# Patient Record
Sex: Female | Born: 1975 | Race: Black or African American | Hispanic: No | Marital: Single | Smoking: Current every day smoker
Health system: Southern US, Community
[De-identification: ages and names within clinical notes are randomized; demographics above are authoritative.]

## PROBLEM LIST (undated history)

## (undated) DIAGNOSIS — I10 Essential (primary) hypertension: Secondary | ICD-10-CM

---

## 2022-04-08 ENCOUNTER — Ambulatory Visit
Admission: EM | Admit: 2022-04-08 | Discharge: 2022-04-08 | Disposition: A | Discharge status: Home / Self Care | Payer: Self-pay | Attending: Family Medicine | Admitting: Family Medicine

## 2022-04-08 DIAGNOSIS — R030 Elevated blood-pressure reading, without diagnosis of hypertension: Secondary | ICD-10-CM

## 2022-04-08 DIAGNOSIS — J3089 Other allergic rhinitis: Secondary | ICD-10-CM

## 2022-04-08 MED ORDER — AMLODIPINE BESYLATE 5 MG PO TABS: 5.0000 mg | ORAL_TABLET | Freq: Every day | ORAL | 0 refills | Status: DC

## 2022-04-08 NOTE — ED Triage Notes (Signed)
Pt presents with elevated blood pressure the last 2 times she tried to donate plasma.

## 2022-04-08 NOTE — ED Provider Notes (Signed)
[facility]    CSN: [id number] Arrival date & time: 04/08/22  1706      History   Chief Complaint Chief Complaint  Patient presents with   Hypertension    HPI Jeanette Petersen is a 46 y.o. female.   Presenting today with concerns of multiple elevated blood pressure readings while trying to donate plasma recently.  She states that she has a long history of elevated blood pressure readings but has never officially been diagnosed with hypertension.  Has only had to take medication for it while pregnant.  She denies any chest pain, headaches, blurry vision, shortness of breath, dizziness.  She has been trying to donate plasma and has gotten readings as high as 190s over 120s at the plasma center so as been told she needs to seek medical care prior to returning.  She is new to the area and does not yet have a primary care provider in the area.  She also complains of about 6 weeks of ongoing hacking cough, postnasal drainage.  She has a history of seasonal allergies but has never had to take anything for it as they were not that bad in [place] where she is from.  She has noticed the worsening symptoms since moving down here about 6 to 8 weeks ago.   History reviewed. No pertinent past medical history.  There are no problems to display for this patient.   History reviewed. No pertinent surgical history.  OB History   No obstetric history on file.      Home Medications    Prior to Admission medications   Medication Sig Start Date End Date Taking? Authorizing Provider  amLODipine (NORVASC) 5 MG tablet Take 1 tablet (5 mg total) by mouth daily. 04/08/22  Yes Particia Nearing, PA-C    Family History Family History  Family history unknown: Yes    Social History Social History   Tobacco Use   Smoking status: Unknown     Allergies   Patient has no known allergies.   Review of Systems Review of Systems Per HPI  Physical Exam Triage Vital Signs ED Triage  Vitals  Enc Vitals Group     BP 04/08/22 1735 (!) 174/121     Pulse Rate 04/08/22 1735 86     Resp 04/08/22 1735 18     Temp 04/08/22 1735 98.2 F (36.8 C)     Temp Source 04/08/22 1735 Oral     SpO2 04/08/22 1735 96 %     Weight --      Height --      Head Circumference --      Peak Flow --      Pain Score 04/08/22 1736 0     Pain Loc --      Pain Edu? --      Excl. in GC? --    No data found.  Updated Vital Signs BP (!) 174/121 (BP Location: Left Arm)   Pulse 86   Temp 98.2 F (36.8 C) (Oral)   Resp 18   LMP 03/28/2022   SpO2 96%   Visual Acuity Right Eye Distance:   Left Eye Distance:   Bilateral Distance:    Right Eye Near:   Left Eye Near:    Bilateral Near:     Physical Exam Vitals and nursing note reviewed.  Constitutional:      Appearance: Normal appearance. She is not ill-appearing.  HENT:     Head: Atraumatic.  Right Ear: Tympanic membrane normal.     Left Ear: Tympanic membrane normal.     Nose: Rhinorrhea present.     Mouth/Throat:     Mouth: Mucous membranes are moist.     Pharynx: Posterior oropharyngeal erythema present. No oropharyngeal exudate.  Eyes:     Extraocular Movements: Extraocular movements intact.     Conjunctiva/sclera: Conjunctivae normal.  Cardiovascular:     Rate and Rhythm: Normal rate and regular rhythm.     Heart sounds: Normal heart sounds.  Pulmonary:     Effort: Pulmonary effort is normal.     Breath sounds: Normal breath sounds. No wheezing or rales.  Musculoskeletal:        General: Normal range of motion.     Cervical back: Normal range of motion and neck supple.  Skin:    General: Skin is warm and dry.  Neurological:     Mental Status: She is alert and oriented to person, place, and time.  Psychiatric:        Mood and Affect: Mood normal.        Thought Content: Thought content normal.        Judgment: Judgment normal.     UC Treatments / Results  Labs (all labs ordered are listed, but only abnormal  results are displayed) Labs Reviewed - No data to display  EKG   Radiology No results found.  Procedures Procedures (including critical care time)  Medications Ordered in UC Medications - No data to display  Initial Impression / Assessment and Plan / UC Course  I have reviewed the triage vital signs and the nursing notes.  Pertinent labs & imaging results that were available during my care of the patient were reviewed by me and considered in my medical decision making (see chart for details).     Blood pressure significantly elevated today and per plasma center records has been elevated numerous times.  We will start low-dose amlodipine, DASH diet, increase exercise and work on establishing with primary care.  Discussed to follow-up here if not in with a primary care in the next 30 days to recheck blood pressure and adjust medication.  Try to keep home log of readings.  Return for any side effects or worsening symptoms in the meantime.  Suspect her ongoing cough is related to seasonal allergy exacerbation.  She states that she has Claritin and nasal spray at home and will start taking them consistently.  Final Clinical Impressions(s) / UC Diagnoses   Final diagnoses:  Elevated blood pressure reading  Seasonal allergic rhinitis due to other allergic trigger     Discharge Instructions      Go to the [facility] health website and click on the find a provider tab.  You can set up to establish care with a primary care straight from the website.  For your allergies, take an antihistamine at least once a day and a steroid nasal spray such as Flonase twice a day.  Follow-up either here or with primary care in the next 30 days to recheck your blood pressure and get a refill on your medication    ED Prescriptions     Medication Sig Dispense Auth. Provider   amLODipine (NORVASC) 5 MG tablet Take 1 tablet (5 mg total) by mouth daily. 30 tablet Particia Nearing, [place]      PDMP not  reviewed this encounter.   Particia Nearing, [place] 04/08/22 1818

## 2022-04-08 NOTE — Discharge Instructions (Signed)
Go to the [facility] health website and click on the find a provider tab.  You can set up to establish care with a primary care straight from the website.  For your allergies, take an antihistamine at least once a day and a steroid nasal spray such as Flonase twice a day.  Follow-up either here or with primary care in the next 30 days to recheck your blood pressure and get a refill on your medication

## 2022-04-14 ENCOUNTER — Observation Stay (HOSPITAL_COMMUNITY): Payer: Self-pay

## 2022-04-14 ENCOUNTER — Encounter (HOSPITAL_COMMUNITY): Payer: Self-pay | Admitting: Emergency Medicine

## 2022-04-14 ENCOUNTER — Ambulatory Visit: Admission: RE | Admit: 2022-04-14 | Discharge status: Absent without leave | Payer: Self-pay | Source: Ambulatory Visit

## 2022-04-14 ENCOUNTER — Other Ambulatory Visit: Payer: Self-pay

## 2022-04-14 ENCOUNTER — Emergency Department (HOSPITAL_COMMUNITY): Payer: Self-pay

## 2022-04-14 ENCOUNTER — Observation Stay (HOSPITAL_COMMUNITY)
Admission: EM | Admit: 2022-04-14 | Discharge: 2022-04-16 | Disposition: A | Discharge status: Home / Self Care | Payer: Self-pay | Attending: Internal Medicine | Admitting: Internal Medicine

## 2022-04-14 ENCOUNTER — Ambulatory Visit
Admission: RE | Admit: 2022-04-14 | Discharge: 2022-04-14 | Disposition: A | Discharge status: Home / Self Care | Payer: Self-pay | Source: Ambulatory Visit | Attending: Urgent Care | Admitting: Urgent Care

## 2022-04-14 VITALS — BP 170/121 | HR 90 | Temp 98.1°F | Resp 20

## 2022-04-14 DIAGNOSIS — I10 Essential (primary) hypertension: Secondary | ICD-10-CM

## 2022-04-14 DIAGNOSIS — I160 Hypertensive urgency: Secondary | ICD-10-CM

## 2022-04-14 DIAGNOSIS — R079 Chest pain, unspecified: Secondary | ICD-10-CM

## 2022-04-14 DIAGNOSIS — R519 Headache, unspecified: Secondary | ICD-10-CM

## 2022-04-14 DIAGNOSIS — G459 Transient cerebral ischemic attack, unspecified: Principal | ICD-10-CM

## 2022-04-14 DIAGNOSIS — R200 Anesthesia of skin: Secondary | ICD-10-CM

## 2022-04-14 DIAGNOSIS — D72829 Elevated white blood cell count, unspecified: Secondary | ICD-10-CM | POA: Insufficient documentation

## 2022-04-14 DIAGNOSIS — F17210 Nicotine dependence, cigarettes, uncomplicated: Secondary | ICD-10-CM | POA: Insufficient documentation

## 2022-04-14 DIAGNOSIS — I161 Hypertensive emergency: Secondary | ICD-10-CM | POA: Diagnosis present

## 2022-04-14 DIAGNOSIS — Z79899 Other long term (current) drug therapy: Secondary | ICD-10-CM | POA: Insufficient documentation

## 2022-04-14 HISTORY — DX: Essential (primary) hypertension: I10

## 2022-04-14 LAB — I-STAT BETA HCG BLOOD, ED (MC, WL, AP ONLY): I-stat hCG, quantitative: <5 m[IU]/mL (ref <5)

## 2022-04-14 LAB — HEPATIC FUNCTION PANEL
ALT: 18 U/L (ref 0–44)
AST: 16 U/L (ref 15–41)
Albumin: 3.5 g/dL (ref 3.5–5.0)
Alkaline Phosphatase: 62 U/L (ref 38–126)
Bilirubin, Direct: 0.1 mg/dL (ref 0.0–0.2)
Indirect Bilirubin: 0.4 mg/dL (ref 0.3–0.9)
Total Bilirubin: 0.5 mg/dL (ref 0.3–1.2)
Total Protein: 7.2 g/dL (ref 6.5–8.1)

## 2022-04-14 LAB — BASIC METABOLIC PANEL
Anion gap: 10 (ref 5–15)
BUN: 7 mg/dL (ref 6–20)
CO2: 25 mmol/L (ref 22–32)
Calcium: 9.8 mg/dL (ref 8.9–10.3)
Chloride: 101 mmol/L (ref 98–111)
Creatinine, Ser: 0.83 mg/dL (ref 0.44–1.00)
GFR, Estimated: >60 mL/min (ref >60)
Glucose, Bld: 102 mg/dL — ABNORMAL HIGH (ref 70–99)
Potassium: 3.6 mmol/L (ref 3.5–5.1)
Sodium: 136 mmol/L (ref 135–145)

## 2022-04-14 LAB — CBC
HCT: 50.4 % — ABNORMAL HIGH (ref 36.0–46.0)
Hemoglobin: 16.5 g/dL — ABNORMAL HIGH (ref 12.0–15.0)
MCH: 29.9 pg (ref 26.0–34.0)
MCHC: 32.7 g/dL (ref 30.0–36.0)
MCV: 91.5 fL (ref 80.0–100.0)
Platelets: 313 10*3/uL (ref 150–400)
RBC: 5.51 MIL/uL — ABNORMAL HIGH (ref 3.87–5.11)
RDW: 14 % (ref 11.5–15.5)
WBC: 12.3 10*3/uL — ABNORMAL HIGH (ref 4.0–10.5)
nRBC: 0 % (ref 0.0–0.2)

## 2022-04-14 LAB — LIPASE, BLOOD: Lipase: 29 U/L (ref 11–51)

## 2022-04-14 LAB — TROPONIN I (HIGH SENSITIVITY)
Troponin I (High Sensitivity): 9 ng/L (ref <18)
Troponin I (High Sensitivity): 10 ng/L (ref <18)

## 2022-04-14 IMAGING — CT CT HEAD W/O CM
3 of 4 series · 15 of 47 positions shown, 18 images · non-contrast
Comparison: None Available.

CLINICAL DATA: Headache, new or worsening, neuro deficit (Age
18-49y)



[Series 4: head 2.0 h70h · axial · 0.46mm/px · z∈[-67,+51]mm · 9 of 75 slices shown, 12 images]
[im 8/75  brain]
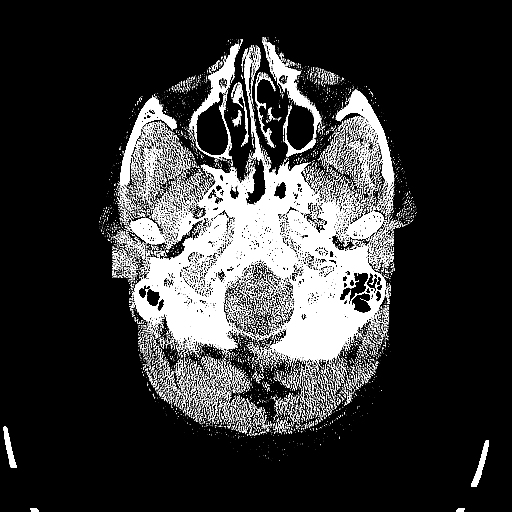
[im 8/75  bone]
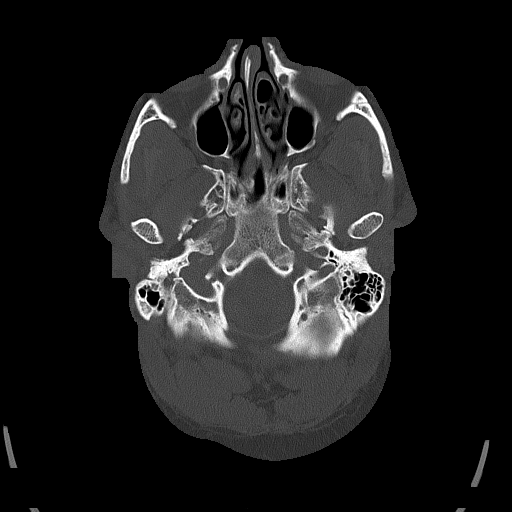
[im 15/75  brain]
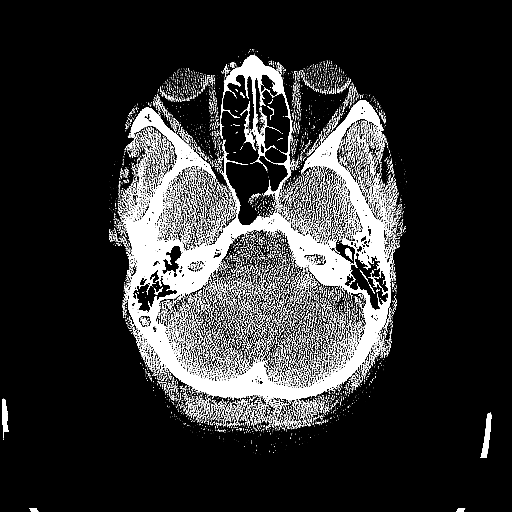
[im 23/75  brain]
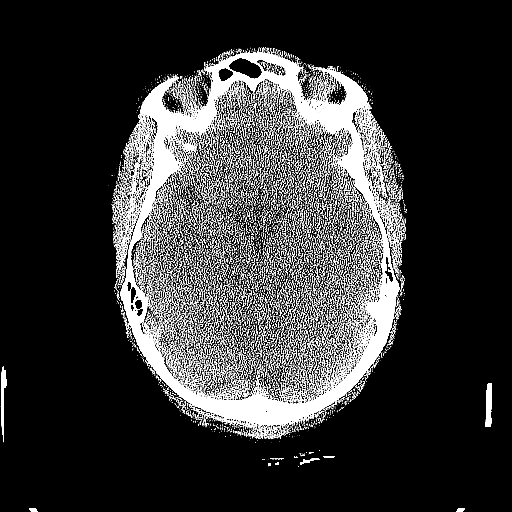
[im 30/75  brain]
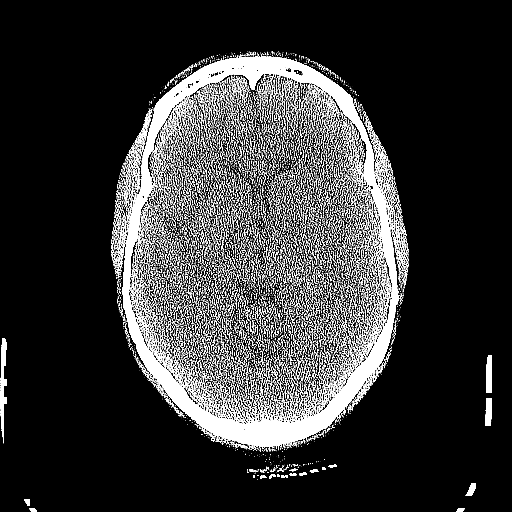
[im 38/75  brain]
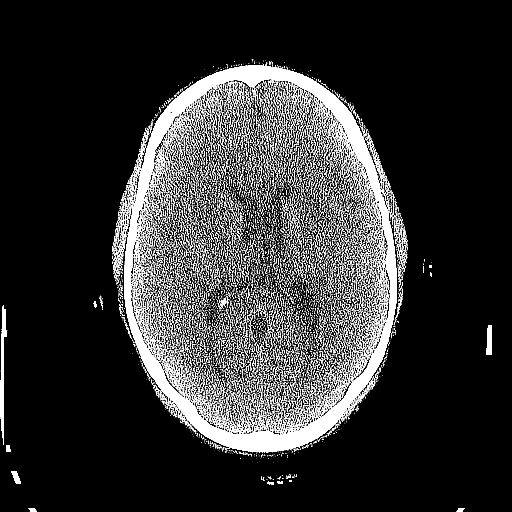
[im 38/75  bone]
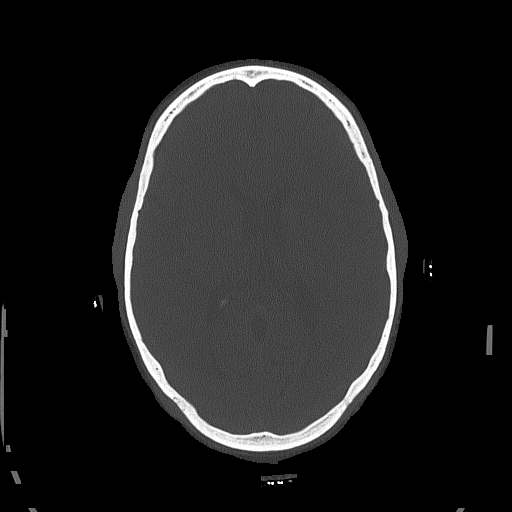
[im 45/75  brain]
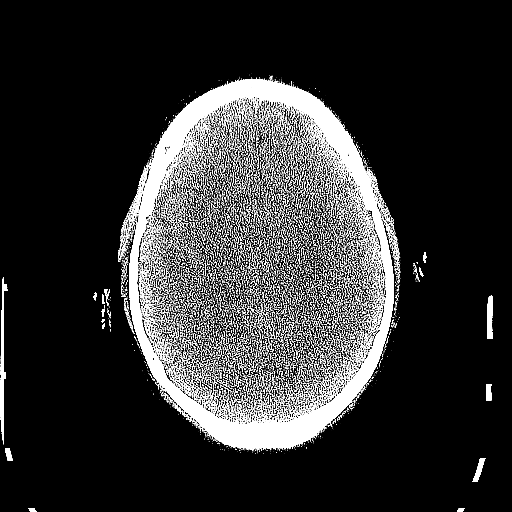
[im 52/75  brain]
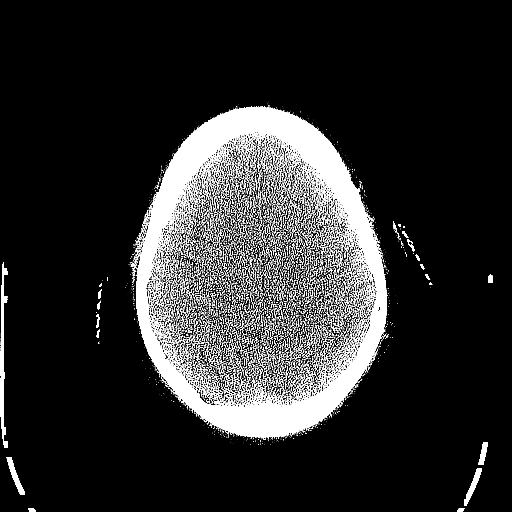
[im 60/75  brain]
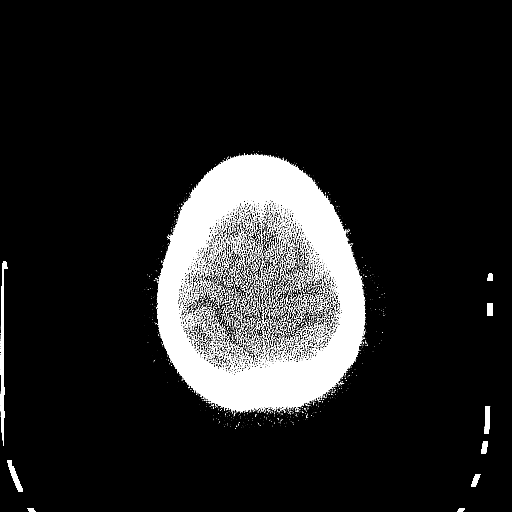
[im 67/75  brain]
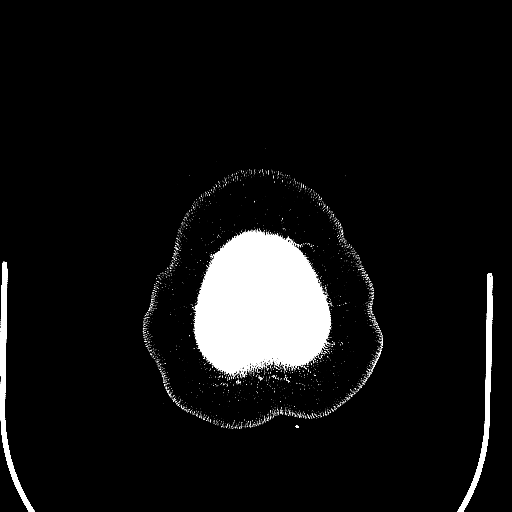
[im 67/75  bone]
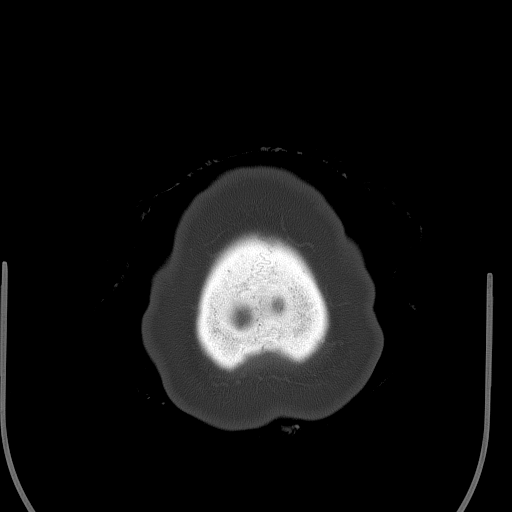

[Series 5: head 3.0 mpr cor · coronal · 0.33mm/px · 3 of 70 slices shown]
[im 24/70  brain]
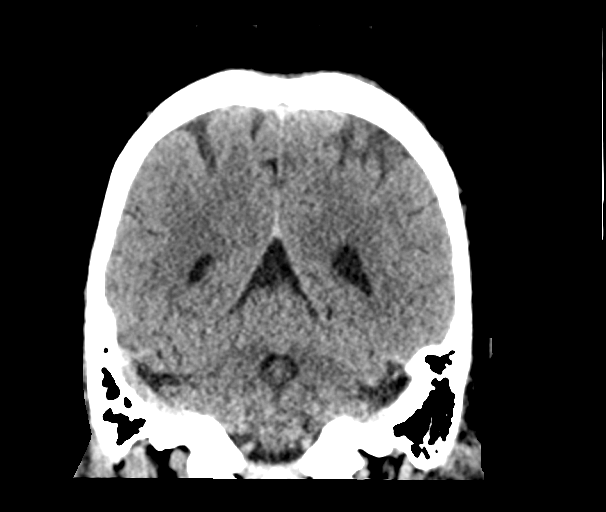
[im 31/70  brain]
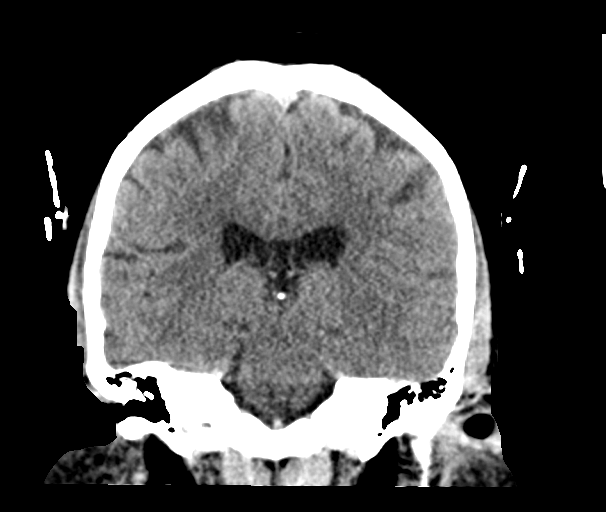
[im 39/70  brain]
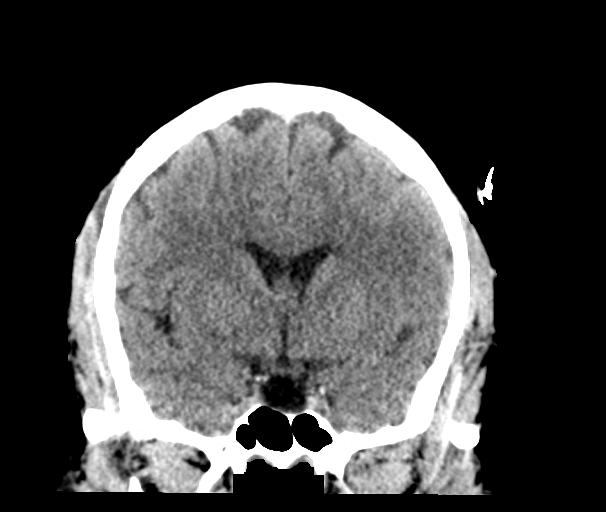

[Series 6: head 3.0 mpr sag · sagittal · 0.35mm/px · 3 of 59 slices shown]
[im 20/59  brain]
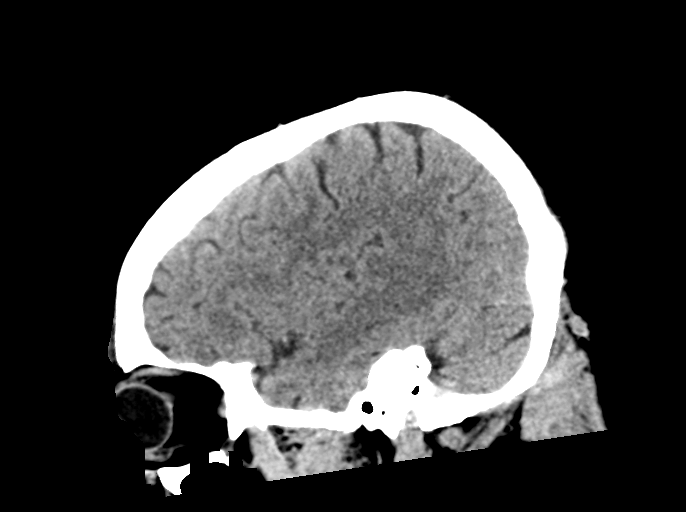
[im 30/59  brain]
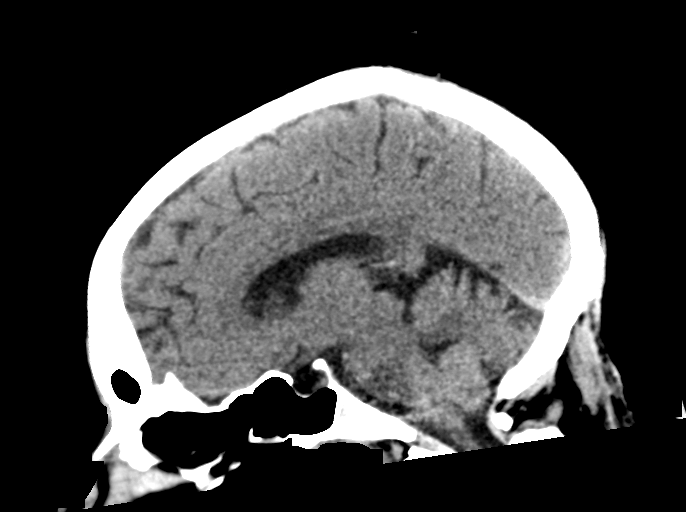
[im 39/59  brain]
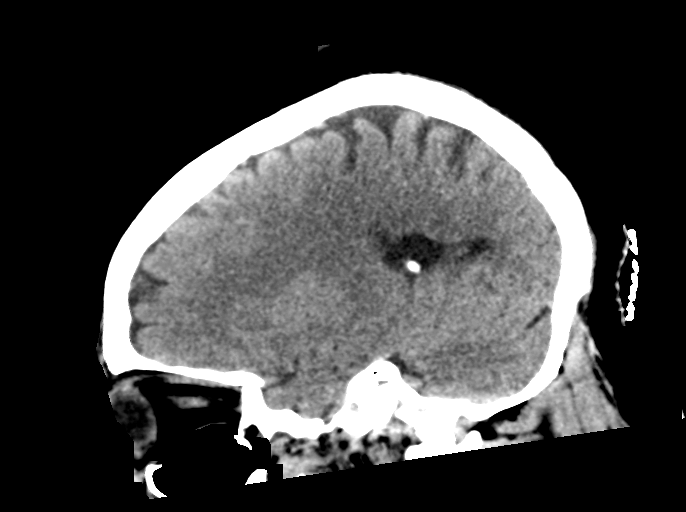

[15 of 47 positions shown; findings below may reference images not displayed]

FINDINGS: Brain: No evidence of acute infarction, hemorrhage, hydrocephalus,
extra-axial collection or mass lesion/mass effect. Partially empty
sella.

Vascular: No hyperdense vessel identified.

Skull: No acute fracture.

Sinuses/Orbits: Opacified left frontal sinus. Otherwise, clear
sinuses. No acute orbital findings.

Other: Trace right mastoid effusion.
IMPRESSION: 1. No evidence of acute abnormality.
2. Partially empty sella, which is often a normal anatomic variant
but can be associated with idiopathic intracranial hypertension
(pseudotumor cerebri).
3. Opacified left frontal sinus.

## 2022-04-14 IMAGING — CT CT ANGIO HEAD-NECK (W OR W/O PERF)
3 of 7 series · 10 of 36 positions shown · IV contrast (OMNI 350)
Comparison: No prior CTA, correlation made with CT head [DATE]

CLINICAL DATA: Severe hypertension, left-sided weakness, dizziness,
headache

EXAM:
CT ANGIOGRAPHY HEAD AND NECK
TECHNIQUE: Multidetector CT imaging of the head and neck was performed using
the standard protocol during bolus administration of intravenous
contrast. Multiplanar CT image reconstructions and MIPs were
obtained to evaluate the vascular anatomy. Carotid stenosis
measurements (when applicable) are obtained utilizing NASCET
criteria, using the distal internal carotid diameter as the
denominator.

[Series 5: cta neck · axial · 0.60mm/px · z∈[-204,-80]mm · 2 of 187 slices shown]
[im 63/187  soft-tissue]
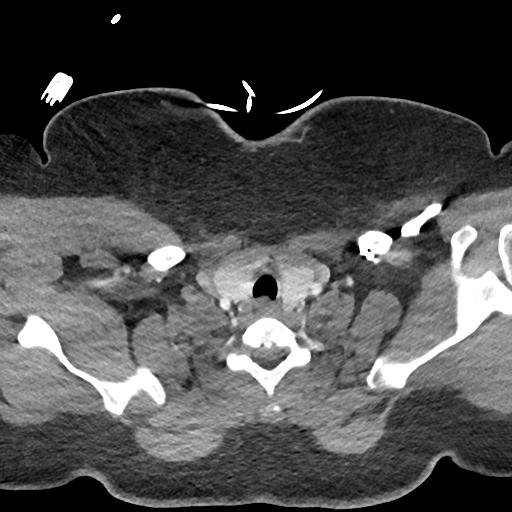
[im 125/187  soft-tissue]
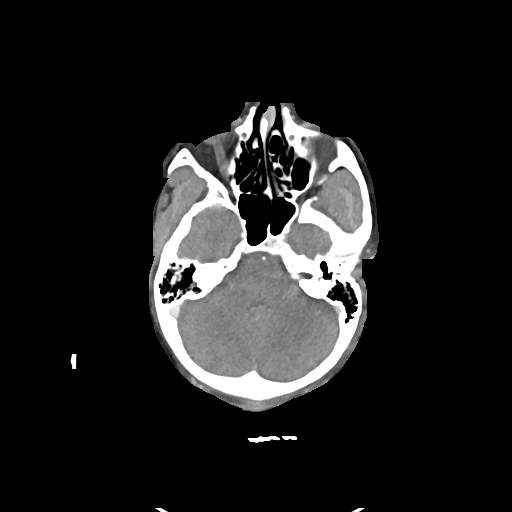

[Series 7: cta neck axial · axial · 0.58mm/px · z∈[-311,-37]mm · 6 of 385 slices shown]
[im 55/385  soft-tissue]
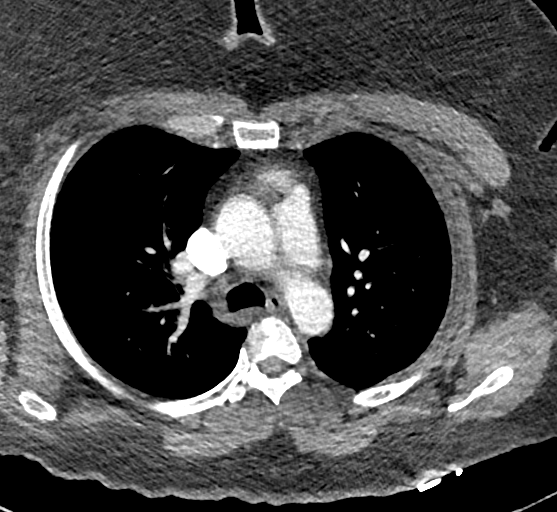
[im 110/385  bone]
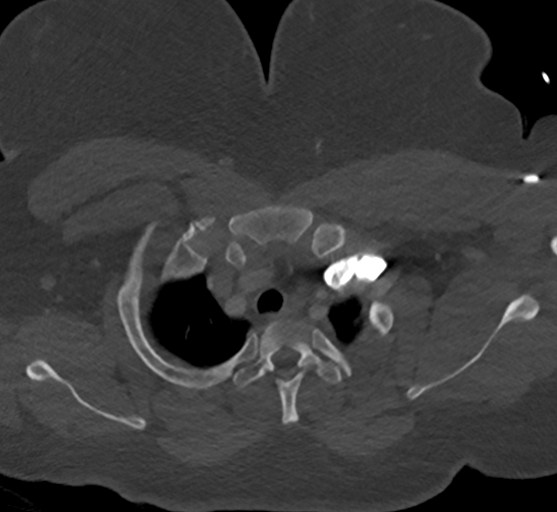
[im 165/385  soft-tissue]
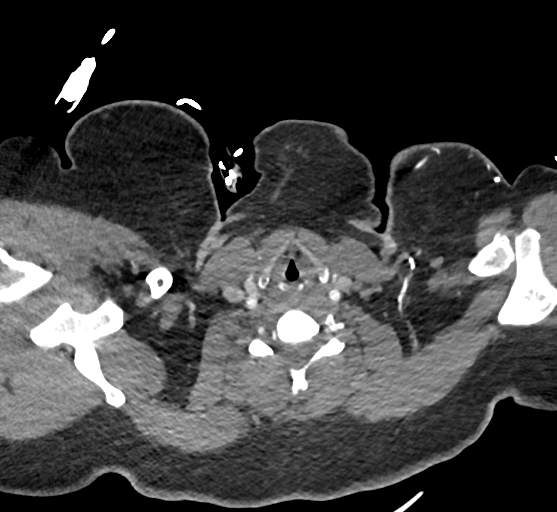
[im 220/385  bone]
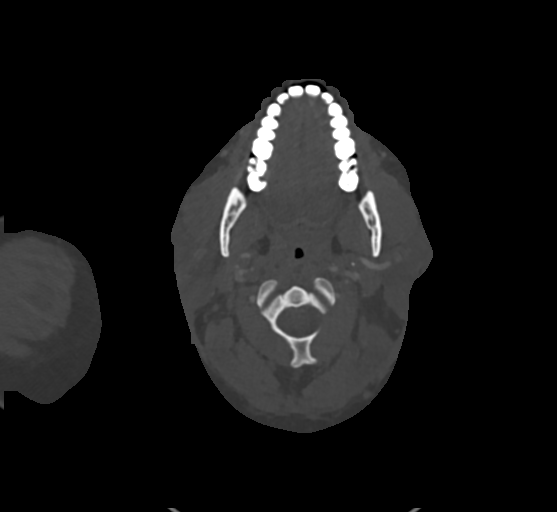
[im 275/385  soft-tissue]
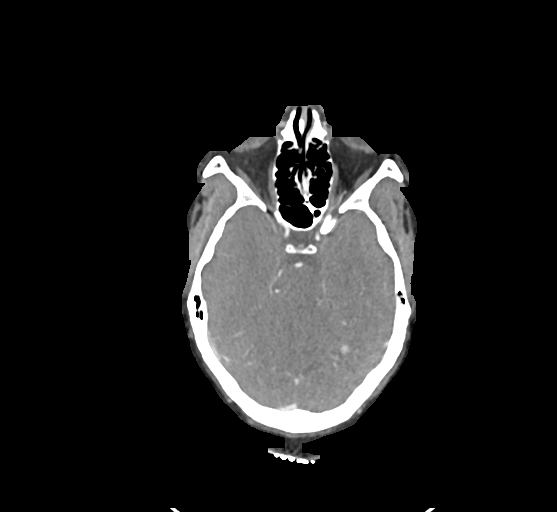
[im 330/385  bone]
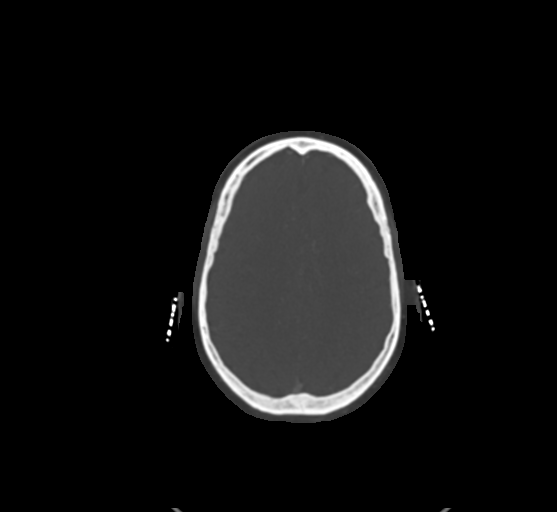

[Series 9: cta neck sagittal · sagittal · 0.63mm/px · 2 of 186 slices shown]
[im 28/186  soft-tissue]
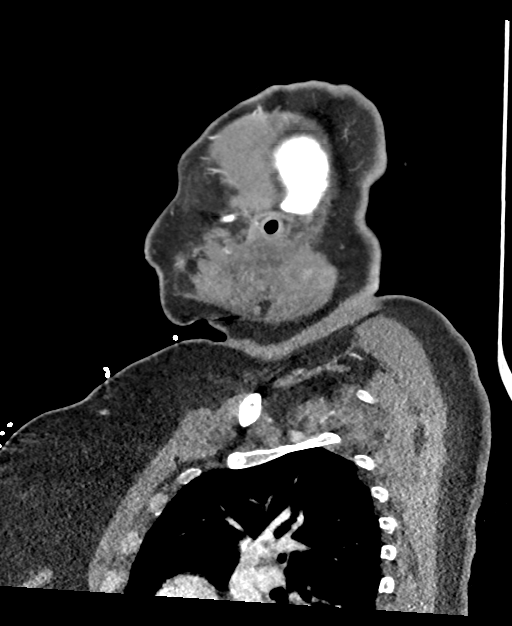
[im 159/186  soft-tissue]
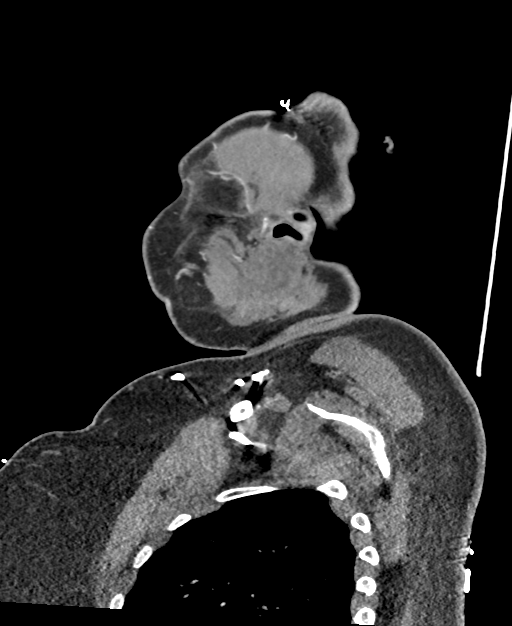

[10 of 36 positions shown; findings below may reference images not displayed]

RADIATION DOSE REDUCTION: This exam was performed according to the
departmental dose-optimization program which includes automated
exposure control, adjustment of the mA and/or kV according to
patient size and/or use of iterative reconstruction technique.

CONTRAST:  75mL OMNIPAQUE IOHEXOL 350 MG/ML SOLN
FINDINGS: CT HEAD FINDINGS

For noncontrast findings, please see same day CT head.

CTA NECK FINDINGS

Aortic arch: Two-vessel arch with a common origin of the
brachiocephalic and left common carotid arteries. Imaged portion
shows no evidence of aneurysm or dissection. No significant stenosis
of the major arch vessel origins.

Right carotid system: No evidence of dissection, occlusion, or
hemodynamically significant stenosis (greater than 50%).

Left carotid system: No evidence of dissection, occlusion, or
hemodynamically significant stenosis (greater than 50%).

Vertebral arteries: Evaluation of the proximal vertebral arteries is
somewhat limited by photon starvation from overlying soft tissues.
Within this limitation, no evidence of dissection, occlusion, or
hemodynamically significant stenosis (greater than 50%).

Skeleton: No acute osseous abnormality.

Other neck: No acute finding.

Upper chest: No focal pulmonary opacity or pleural effusion.

Review of the MIP images confirms the above findings

CTA HEAD FINDINGS

Evaluation is somewhat limited by bolus timing.

Anterior circulation: Both internal carotid arteries are patent to
the termini, without significant stenosis.

A1 segments patent. Normal anterior communicating artery. Mild
irregularity in the right A2 and A3 segments. Anterior cerebral
arteries are otherwise patent to their distal aspects.

No M1 stenosis or occlusion. Normal MCA bifurcations. Distal MCA
branches perfused and symmetric.

Posterior circulation: Vertebral arteries patent to the
vertebrobasilar junction without stenosis. Posterior inferior
cerebellar artery patent on the left. Likely a right AICA.

Basilar patent to its distal aspect. Superior cerebellar arteries
patent proximally.

Patent P1 segments. PCAs perfused to their distal aspects without
stenosis. The bilateral posterior communicating arteries are not
visualized.

Venous sinuses: As permitted by contrast timing, patent.

Anatomic variants: None significant.

Review of the MIP images confirms the above findings
IMPRESSION: 1.  No intracranial large vessel occlusion or significant stenosis.
2.  No hemodynamically significant stenosis in the neck.

## 2022-04-14 IMAGING — DX DG CHEST 2V
2 series · 2 of 2 positions shown · non-contrast
Comparison: None Available.

CLINICAL DATA: Chest pain

EXAM:
CHEST - 2 VIEW

[chest pa]
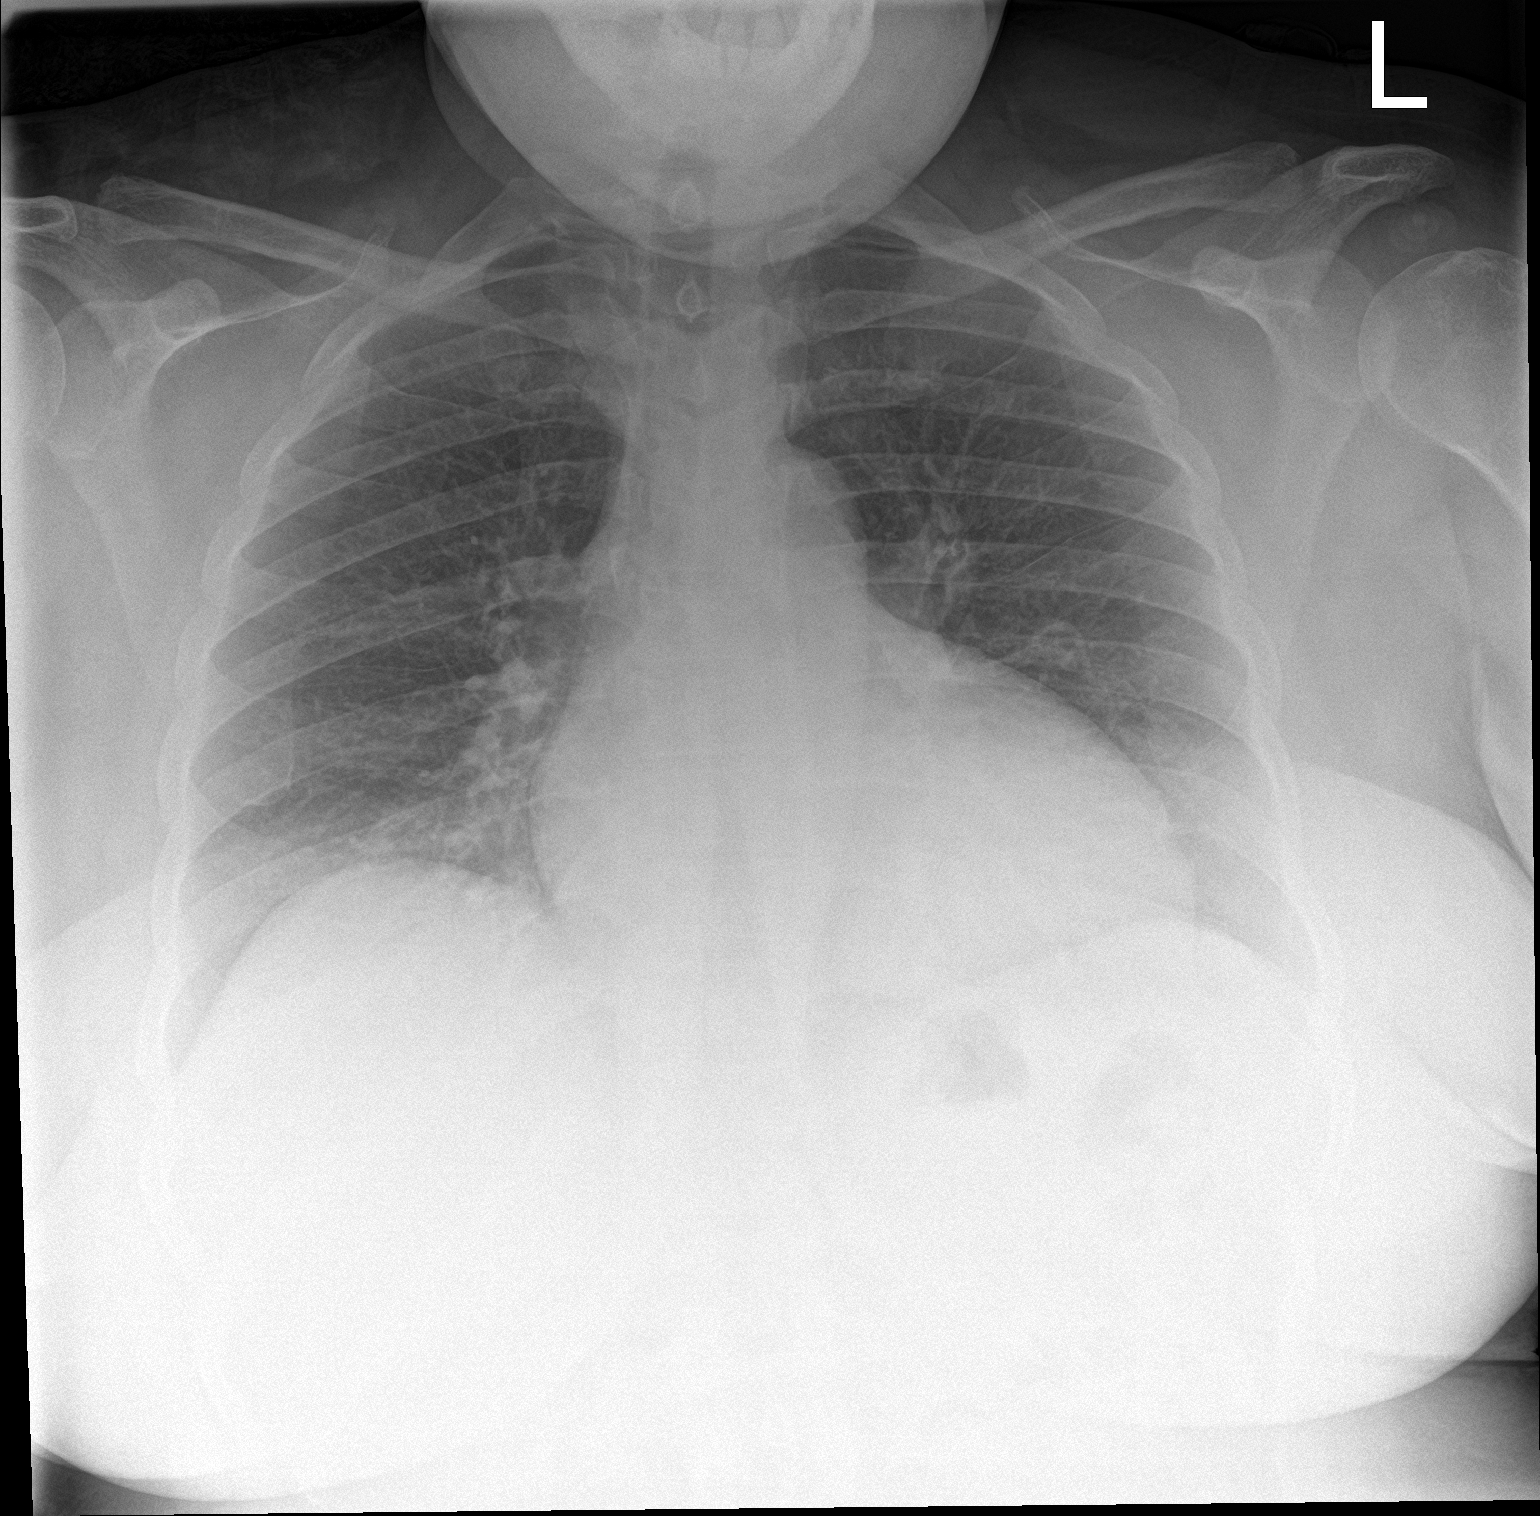

[chest lat]
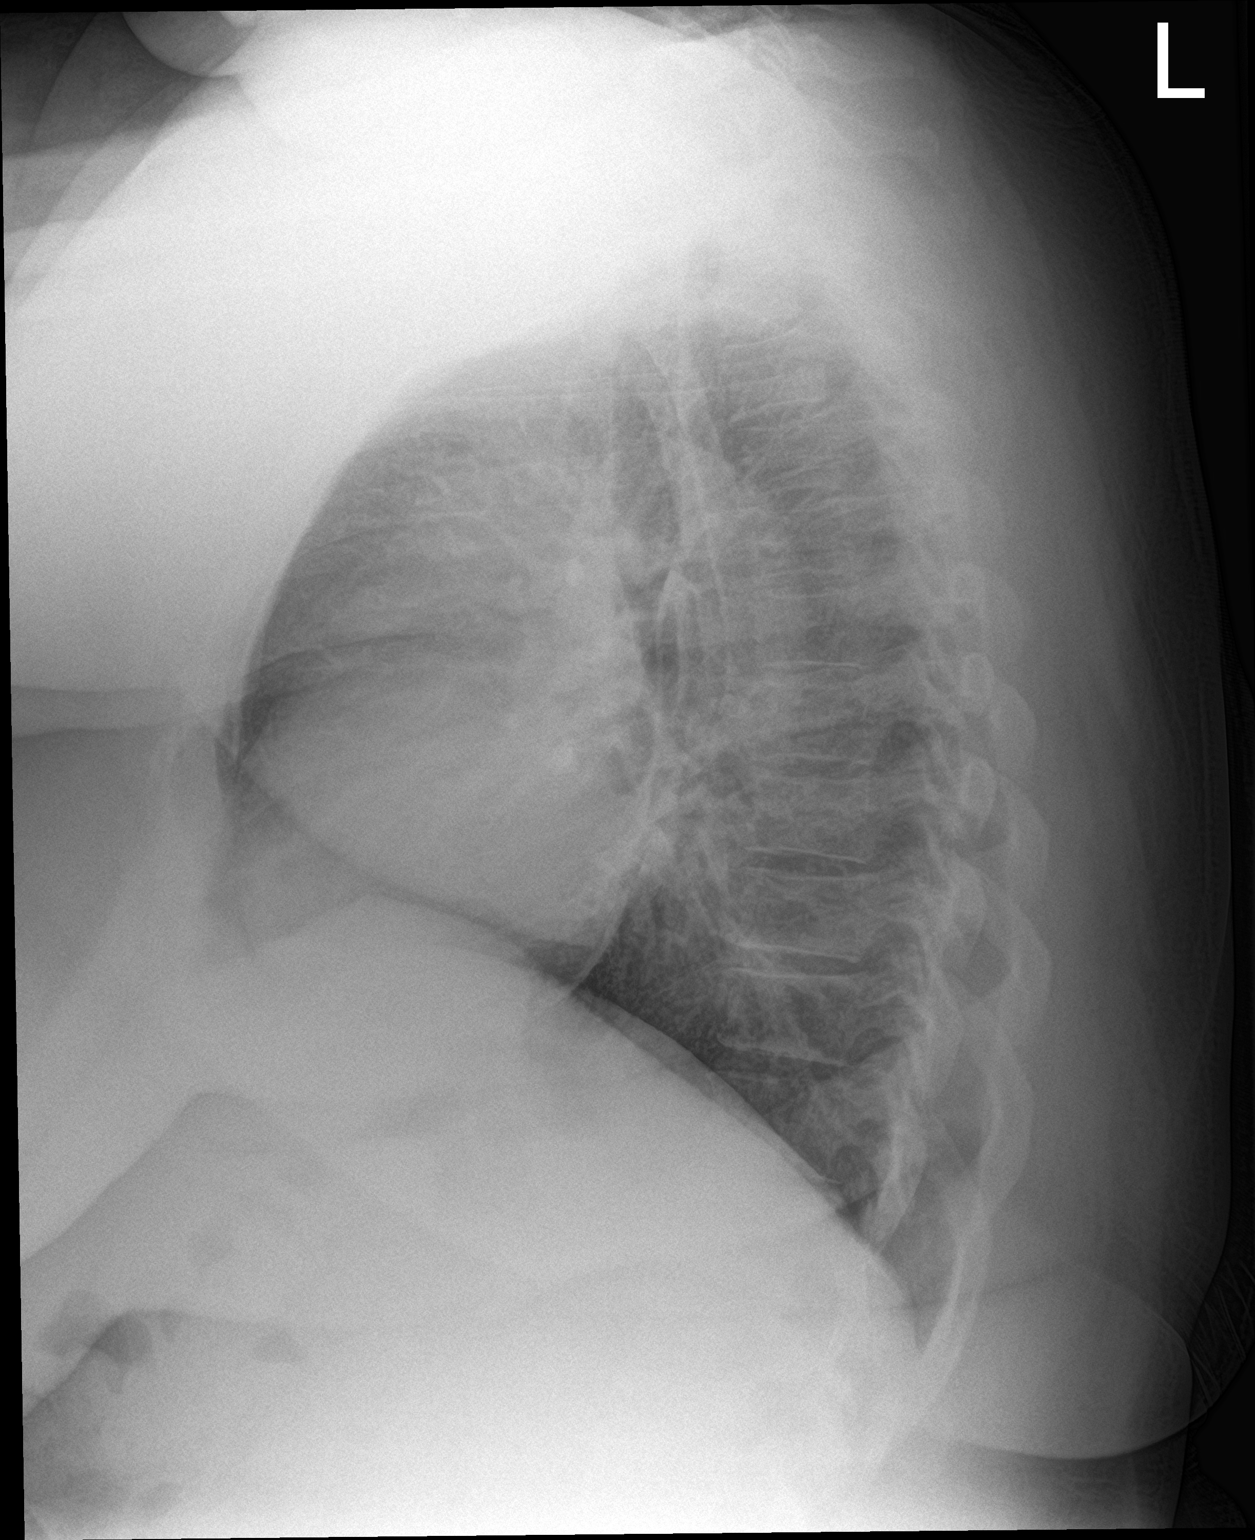

[2 of 2 positions shown; findings below may reference images not displayed]

FINDINGS: Lungs are clear.  No pleural effusion or pneumothorax.

Heart is normal in size.

Visualized osseous structures are within normal limits.
IMPRESSION: Normal chest radiographs.

## 2022-04-14 MED ORDER — STROKE: EARLY STAGES OF RECOVERY BOOK
Freq: Once | Status: AC
  Filled 2022-04-14: qty 1

## 2022-04-14 MED ORDER — ACETAMINOPHEN 325 MG PO TABS: 650.0000 mg | ORAL_TABLET | ORAL | Status: DC | PRN

## 2022-04-14 MED ORDER — HYDRALAZINE HCL 20 MG/ML IJ SOLN: 10.0000 mg | Freq: Four times a day (QID) | INTRAMUSCULAR | Status: DC | PRN

## 2022-04-14 MED ORDER — ASPIRIN 81 MG PO CHEW
81.0000 mg | CHEWABLE_TABLET | Freq: Every day | ORAL | Status: DC
  Administered 2022-04-14 – 2022-04-16 (×3): 81 mg via ORAL
  Filled 2022-04-14 (×3): qty 1

## 2022-04-14 MED ORDER — ACETAMINOPHEN 650 MG RE SUPP: 650.0000 mg | RECTAL | Status: DC | PRN

## 2022-04-14 MED ORDER — IOHEXOL 350 MG/ML SOLN
75.0000 mL | Freq: Once | INTRAVENOUS | Status: AC | PRN
  Administered 2022-04-14: 75 mL via INTRAVENOUS

## 2022-04-14 MED ORDER — ACETAMINOPHEN 325 MG PO TABS
650.0000 mg | ORAL_TABLET | Freq: Four times a day (QID) | ORAL | 0 refills | Status: DC | PRN
Start: 2022-04-14 — End: 2022-04-14

## 2022-04-14 MED ORDER — ENOXAPARIN SODIUM 40 MG/0.4ML IJ SOSY
40.0000 mg | PREFILLED_SYRINGE | Freq: Every day | INTRAMUSCULAR | Status: DC
  Administered 2022-04-14 – 2022-04-15 (×2): 40 mg via SUBCUTANEOUS
  Filled 2022-04-14 (×2): qty 0.4

## 2022-04-14 MED ORDER — ACETAMINOPHEN 325 MG PO TABS: 650.0000 mg | ORAL_TABLET | Freq: Four times a day (QID) | ORAL | 0 refills | Status: AC | PRN

## 2022-04-14 MED ORDER — AMLODIPINE BESYLATE 5 MG PO TABS
5.0000 mg | ORAL_TABLET | Freq: Every day | ORAL | Status: DC
  Administered 2022-04-15 – 2022-04-16 (×2): 5 mg via ORAL
  Filled 2022-04-14 (×2): qty 1

## 2022-04-14 MED ORDER — HYDRALAZINE HCL 20 MG/ML IJ SOLN
10.0000 mg | Freq: Once | INTRAMUSCULAR | Status: AC
  Administered 2022-04-14: 10 mg via INTRAVENOUS
  Filled 2022-04-14: qty 1

## 2022-04-14 MED ORDER — ACETAMINOPHEN 325 MG PO TABS
975.0000 mg | ORAL_TABLET | Freq: Once | ORAL | Status: AC
  Administered 2022-04-14: 975 mg via ORAL

## 2022-04-14 MED ORDER — AMLODIPINE BESYLATE 5 MG PO TABS: 5.0000 mg | ORAL_TABLET | Freq: Every day | ORAL | 0 refills | Status: DC

## 2022-04-14 MED ORDER — IBUPROFEN 200 MG PO TABS
600.0000 mg | ORAL_TABLET | Freq: Four times a day (QID) | ORAL | Status: DC | PRN
  Administered 2022-04-14 – 2022-04-16 (×2): 600 mg via ORAL
  Filled 2022-04-14: qty 3
  Filled 2022-04-14: qty 1

## 2022-04-14 MED ORDER — HYDRALAZINE HCL 20 MG/ML IJ SOLN: 5.0000 mg | Freq: Four times a day (QID) | INTRAMUSCULAR | Status: DC | PRN

## 2022-04-14 MED ORDER — CLOPIDOGREL BISULFATE 75 MG PO TABS
75.0000 mg | ORAL_TABLET | Freq: Every day | ORAL | Status: DC
  Administered 2022-04-14: 75 mg via ORAL
  Filled 2022-04-14: qty 1

## 2022-04-14 MED ORDER — HYDRALAZINE HCL 20 MG/ML IJ SOLN: 5.0000 mg | INTRAMUSCULAR | Status: DC | PRN

## 2022-04-14 MED ORDER — ACETAMINOPHEN 160 MG/5ML PO SOLN: 650.0000 mg | ORAL | Status: DC | PRN

## 2022-04-14 NOTE — Consult Note (Signed)
Neurology Consult H&P  Jeanette Petersen MR# [id number] 04/14/2022   CC: Hypertensive emergency  History is obtained from: Patient and ED staff and chart.  HPI: Jeanette Petersen is a 46 y.o. female PMHx as reviewed below severely uncontrolled hypertension however she does not take medications because they make her dizzy and she cannot afford them.  She had a bad headache last night was seen in urgent care and she was recommended to present to the ED for further evaluation.  She states she did have a headache last night.   LKW: Unclear tNK given: No to mild IR Thrombectomy No, not indicated Modified Rankin Scale: 0-Completely asymptomatic and back to baseline post- stroke NIHSS: 1 -mildly decreased sensory on the right upper and lower  ROS: A complete ROS was performed and is negative except as noted in the HPI.   Past Medical History:  Diagnosis Date   Hypertension      Family History  Family history unknown: Yes    Social History:  reports that she has been smoking cigarettes. She has quit using smokeless tobacco. She reports that she does not drink alcohol and does not use drugs.   Prior to Admission medications   Medication Sig Start Date End Date Taking? Authorizing Provider  acetaminophen (TYLENOL) 325 MG tablet Take 2 tablets (650 mg total) by mouth every 6 (six) hours as needed for moderate pain or headache. 04/14/22   Wallis Bamberg, PA-C  amLODipine (NORVASC) 5 MG tablet Take 1 tablet (5 mg total) by mouth daily. 04/14/22   Wallis Bamberg, PA-C    Exam: Current vital signs: BP (!) 170/104   Pulse 92   Temp 98.5 F (36.9 C) (Oral)   Resp 12   LMP 03/31/2022   SpO2 97%   Physical Exam  Constitutional: Appears well-developed and well-nourished.  Psych: Affect appropriate to situation Eyes: No scleral injection HENT: No OP obstruction. Head: Normocephalic.  Cardiovascular: Normal rate and regular rhythm.  Respiratory: Effort normal, symmetric excursions bilaterally, no  audible wheezing. GI: Soft.  No distension. There is no tenderness.  Skin: WDI  Neuro: Mental Status: Patient is awake, alert, oriented to person, place, month, year, and situation. Patient is able to give a clear and coherent history. Speech  fluent, intact comprehension and repetition. No signs of aphasia or neglect. Visual Fields are full. Pupils are equal, round, and reactive to light. EOMI without ptosis or diplopia.  Facial sensation is symmetric to temperature Facial movement is symmetric.  Hearing is intact to voice. Uvula midline and palate elevates symmetrically. Shoulder shrug is symmetric. Tongue is midline without atrophy or fasciculations.  Tone is normal. Bulk is normal. 5/5 strength was present in all four extremities. Sensation mildly decreased to cold/temperature in the right arm and leg . Deep Tendon Reflexes: 2+ and symmetric in the biceps and patellae. Toes are downgoing bilaterally. FNF and HKS are intact bilaterally. Gait - Deferred  I have reviewed labs in epic and the pertinent results are: NA 136  I have reviewed the images obtained: NCT head showed No evidence of acute abnormality.   Assessment: Jeanette Petersen is a 46 y.o. female PMHx uncontrolled hypertension presents with hypertensive emergency and mild decreased temperature sensation in the right arm and leg.   Impression:  Hypertensive emergency Probable TIA -she is not sure how long the sensory disturbances were present  Plan: - MRI brain without contrast. - Recommend vascular imaging with CT A head and neck. - Recommend TTE. - Recommend labs: HbA1c, lipid  panel. - Recommend Statin for goal LDL <70. - Goal A1c <7. - Aspirin 81mg  daily. - Clopidogrel 75mg  daily for 3 weeks. - STAT chest X-ray.  Gradually reduce MAP: ~10-20% the first hour and by 5-15% over the next 23 hours.  Target blood pressure of <180/<130mmHg for the first hour. Then <160/<18mmHg for the next 23 hours.  -  Telemetry monitoring for arrhythmia. - Recommend bedside Swallow screen. - Recommend Stroke education. - Recommend PT/OT/SLP consult.   This patient is critically ill and at significant risk of neurological worsening, death and care requires constant monitoring of vital signs, hemodynamics,respiratory and cardiac monitoring, neurological assessment, discussion with family, other specialists and medical decision making of high complexity. I spent 75 minutes of neurocritical care time  in the care of  this patient. This was time spent independent of any time provided by nurse practitioner or PA.  Electronically signed by:  30m, MD Page: 9m 04/14/2022, 7:23 PM  If 7pm- 7am, please page neurology on call as listed in AMION. Normal

## 2022-04-14 NOTE — ED Provider Notes (Signed)
[facility]   CSN: [id number] Arrival date & time: 04/14/22  1631     History  Chief Complaint  Patient presents with   Chest Pain    Jeanette Petersen is a 46 y.o. female with a past medical history of hypertension, has not been on any medications for about 8 years per her report who presents today for evaluation of headache, chest pain, and hypertension. She states that she developed pain in her chest on Thursday, 4 days ago.  On Friday she developed headache.  She feels like last night her eyes may have been twitching.  She states that she feels very dizzy and off balance.  She denies any falls.  She denies any fevers.  Her chest pain is constant.  It does not radiate or move.  She denies any changes in vision or loss of vision other than reports being photophobic.  She does not have a significant migraine history per her report.    HPI     Home Medications Prior to Admission medications   Medication Sig Start Date End Date Taking? Authorizing Provider  acetaminophen (TYLENOL) 325 MG tablet Take 2 tablets (650 mg total) by mouth every 6 (six) hours as needed for moderate pain or headache. 04/14/22   Wallis BambergMani, Mario, PA-C  amLODipine (NORVASC) 5 MG tablet Take 1 tablet (5 mg total) by mouth daily. 04/14/22   Wallis BambergMani, Mario, PA-C      Allergies    Patient has no known allergies.    Review of Systems   Review of Systems See above Physical Exam Updated Vital Signs BP 128/60   Pulse 94   Temp 98.5 F (36.9 C) (Oral)   Resp 17   LMP 03/31/2022   SpO2 94%  Physical Exam Vitals and nursing note reviewed.  Constitutional:      General: She is not in acute distress.    Appearance: She is not ill-appearing.     Comments: Covers eyes with blanket  HENT:     Head: Atraumatic.  Eyes:     Conjunctiva/sclera: Conjunctivae normal.  Cardiovascular:     Rate and Rhythm: Normal rate.  Pulmonary:     Effort: Pulmonary effort is normal.  No respiratory distress.  Chest:     Chest wall: No tenderness.  Abdominal:     General: There is no distension.  Musculoskeletal:     Cervical back: Normal range of motion and neck supple.     Comments: No obvious acute injury  Skin:    General: Skin is warm and dry.  Neurological:     Mental Status: She is alert.     Comments: Awake and alert, answers all questions appropriately.  Speech is not slurred. Mental Status:  Alert, oriented, thought content appropriate, able to give a coherent history. Speech fluent without evidence of aphasia. Able to follow 2 step commands without difficulty.  Cranial Nerves:  II: Patient unable to tolerate testing due to photophobia III,IV, VI: Patient unable to tolerate testing due to photophobia V,VII: smile symmetric, facial light touch sensation equal VIII: hearing grossly normal to voice  X: uvula elevates symmetrically  XI: bilateral shoulder shrug symmetric and strong XII: midline tongue extension without fassiculations Motor:  Normal tone.  5/5 strength right upper and lower extremity including hip strength. Left grip strength 4/5.  No obvious pronator drift.  Left leg patient has difficulty keeping off the bed for more than 10 seconds.  She has decreased strength in  dorsiflexion and plantarflexion of the left foot/ankle. Cerebellar: normal finger-to-nose with bilateral upper extremities Gait: Left side appears weak with ambulation, however she is able to ambulate. CV: distal pulses palpable throughout    Psychiatric:        Mood and Affect: Mood normal.        Behavior: Behavior normal.    ED Results / Procedures / Treatments   Labs (all labs ordered are listed, but only abnormal results are displayed) Labs Reviewed  BASIC METABOLIC PANEL - Abnormal; Notable for the following components:      Result Value   Glucose, Bld 102 (*)    All other components within normal limits  CBC - Abnormal; Notable for the following components:   WBC  12.3 (*)    RBC 5.51 (*)    Hemoglobin 16.5 (*)    HCT 50.4 (*)    All other components within normal limits  LIPASE, BLOOD  HEPATIC FUNCTION PANEL  HIV ANTIBODY (ROUTINE TESTING W REFLEX)  LIPID PANEL  HEMOGLOBIN A1C  CBC  I-STAT BETA HCG BLOOD, ED ([facility], [facility], [facility] ONLY)  TROPONIN I (HIGH SENSITIVITY)  TROPONIN I (HIGH SENSITIVITY)    EKG None  Radiology CT ANGIO HEAD NECK W WO CM  Result Date: 04/14/2022 CLINICAL DATA:  Severe hypertension, left-sided weakness, dizziness, headache EXAM: CT ANGIOGRAPHY HEAD AND NECK TECHNIQUE: Multidetector CT imaging of the head and neck was performed using the standard protocol during bolus administration of intravenous contrast. Multiplanar CT image reconstructions and MIPs were obtained to evaluate the vascular anatomy. Carotid stenosis measurements (when applicable) are obtained utilizing NASCET criteria, using the distal internal carotid diameter as the denominator. RADIATION DOSE REDUCTION: This exam was performed according to the departmental dose-optimization program which includes automated exposure control, adjustment of the mA and/or kV according to patient size and/or use of iterative reconstruction technique. CONTRAST:  51mL OMNIPAQUE IOHEXOL 350 MG/ML SOLN COMPARISON:  No prior CTA, correlation made with CT head 04/14/2022 FINDINGS: CT HEAD FINDINGS For noncontrast findings, please see same day CT head. CTA NECK FINDINGS Aortic arch: Two-vessel arch with a common origin of the brachiocephalic and left common carotid arteries. Imaged portion shows no evidence of aneurysm or dissection. No significant stenosis of the major arch vessel origins. Right carotid system: No evidence of dissection, occlusion, or hemodynamically significant stenosis (greater than 50%). Left carotid system: No evidence of dissection, occlusion, or hemodynamically significant stenosis (greater than 50%). Vertebral arteries: Evaluation of the proximal vertebral arteries is  somewhat limited by photon starvation from overlying soft tissues. Within this limitation, no evidence of dissection, occlusion, or hemodynamically significant stenosis (greater than 50%). Skeleton: No acute osseous abnormality. Other neck: No acute finding. Upper chest: No focal pulmonary opacity or pleural effusion. Review of the MIP images confirms the above findings CTA HEAD FINDINGS Evaluation is somewhat limited by bolus timing. Anterior circulation: Both internal carotid arteries are patent to the termini, without significant stenosis. A1 segments patent. Normal anterior communicating artery. Mild irregularity in the right A2 and A3 segments. Anterior cerebral arteries are otherwise patent to their distal aspects. No M1 stenosis or occlusion. Normal MCA bifurcations. Distal MCA branches perfused and symmetric. Posterior circulation: Vertebral arteries patent to the vertebrobasilar junction without stenosis. Posterior inferior cerebellar artery patent on the left. Likely a right AICA. Basilar patent to its distal aspect. Superior cerebellar arteries patent proximally. Patent P1 segments. PCAs perfused to their distal aspects without stenosis. The bilateral posterior communicating arteries are not visualized. Venous sinuses:  As permitted by contrast timing, patent. Anatomic variants: None significant. Review of the MIP images confirms the above findings IMPRESSION: 1.  No intracranial large vessel occlusion or significant stenosis. 2.  No hemodynamically significant stenosis in the neck. Electronically Signed   By: Wiliam Ke M.D.   On: 04/14/2022 20:56   DG Chest 2 View  Result Date: 04/14/2022 CLINICAL DATA:  Chest pain EXAM: CHEST - 2 VIEW COMPARISON:  None Available. FINDINGS: Lungs are clear.  No pleural effusion or pneumothorax. Heart is normal in size. Visualized osseous structures are within normal limits. IMPRESSION: Normal chest radiographs. Electronically Signed   By: Charline Bills M.D.    On: 04/14/2022 18:03   CT HEAD WO CONTRAST ( )  Result Date: 04/14/2022 CLINICAL DATA:  Headache, new or worsening, neuro deficit (Age 64-49y) EXAM: CT HEAD WITHOUT CONTRAST TECHNIQUE: Contiguous axial images were obtained from the base of the skull through the vertex without intravenous contrast. RADIATION DOSE REDUCTION: This exam was performed according to the departmental dose-optimization program which includes automated exposure control, adjustment of the mA and/or kV according to patient size and/or use of iterative reconstruction technique. COMPARISON:  None Available. FINDINGS: Brain: No evidence of acute infarction, hemorrhage, hydrocephalus, extra-axial collection or mass lesion/mass effect. Partially empty sella. Vascular: No hyperdense vessel identified. Skull: No acute fracture. Sinuses/Orbits: Opacified left frontal sinus. Otherwise, clear sinuses. No acute orbital findings. Other: Trace right mastoid effusion. IMPRESSION: 1. No evidence of acute abnormality. 2. Partially empty sella, which is often a normal anatomic variant but can be associated with idiopathic intracranial hypertension (pseudotumor cerebri). 3. Opacified left frontal sinus. Electronically Signed   By: Feliberto Harts M.D.   On: 04/14/2022 18:39    Procedures Procedures    Medications Ordered in ED Medications  hydrALAZINE (APRESOLINE) injection 10 mg (has no administration in time range)  amLODipine (NORVASC) tablet 5 mg (has no administration in time range)   stroke: early stages of recovery book (has no administration in time range)  acetaminophen (TYLENOL) tablet 650 mg (has no administration in time range)    Or  acetaminophen (TYLENOL) 160 MG/5ML solution 650 mg (has no administration in time range)    Or  acetaminophen (TYLENOL) suppository 650 mg (has no administration in time range)  enoxaparin (LOVENOX) injection 40 mg (has no administration in time range)  aspirin chewable tablet 81 mg (has no  administration in time range)  clopidogrel (PLAVIX) tablet 75 mg (has no administration in time range)  hydrALAZINE (APRESOLINE) injection 10 mg (10 mg Intravenous Given 04/14/22 1929)  iohexol (OMNIPAQUE) 350 MG/ML injection 75 mL (75 mLs Intravenous Contrast Given 04/14/22 2015)    ED Course/ Medical Decision Making/ A&P Clinical Course as of 04/14/22 2218  Sun Apr 14, 2022  1813 I spoke with Dr. Thomasena Edis of neurology regarding patient's unknown onset of symptoms. He recommends getting her over to CT scan.  He is currently in CT scan with another patient in 2 currently hold off on calling a code stroke. [EH]  1818 RN aware, is taking patient to CT scan. [EH]  1852 Patient's weakness has slightly improved.  Her blood pressure has improved. [EH]  1942 Neurology recommends CTA head and neck, MRI without contrast, treatment of blood pressure [EH]  2104 I spoke with hospitalist who will see patient for admission. [EH]    Clinical Course User Index [EH] Cristina Gong, PA-C  Medical Decision Making Amount and/or Complexity of Data Reviewed Labs: ordered. Radiology: ordered.  Risk Prescription drug management. Decision regarding hospitalization.   This patient presents to the ED for concern of headache, chest pain, hypertension, left-sided weakness on exam this involves an extensive number of treatment options, and is a complaint that carries with it a high risk of complications and morbidity.  The differential diagnosis includes hypertensive emergency/urgency, dissection, drug use, ACS, PE,   Co morbidities that complicate the patient evaluation  No primary care   Additional history obtained:  Additional history obtained from urgent care note from today    Lab Tests:  I Ordered, and personally interpreted labs.  The pertinent results include:   Troponin is not elevatedx2 Lipase is not elevated, hepatic function panel is normal. Pregnancy test  is negative CBC shows mild leukocytosis with a white count of 12.3, hemoglobin is elevated at 16.5, no priors for comparison   Imaging Studies ordered:  I ordered imaging studies including CT head without contrast, CTA head and neck, MRI brain without contrast I independently visualized imaging which showed CT head without acute intracranial abnormalities, CT a head and neck without evidence of large vessel occlusion or significant stenosis    Consultations Obtained:  I requested consultation with neurology.  I spoke with Dr. Thomasena Edis. We discussed patient's unclear onset of symptoms, and at his recommendation given no clear last known well will not call code stroke, instead she was taken immediately to CT scan of her head as he was already in CT scan which was obtained without evidence of hemorrhage.  Recommends blood pressure control, admission   Problem List / ED Course / Critical interventions / Medication management  Hypertensive emergency I ordered medication including hydralazine for hypertension, after 1 dose she was within the recommended systolic reduction by neurology and therefore did not require a Cardene drip. Reevaluation of the patient after these medicines showed that the patient improved   Social Determinants of Health:  No primary care doctor, self-pay   Test / Admission - Considered:  Patient will require admission for blood pressure control in the setting of weakness in the left arm and leg on my exam with significant hypertension.  Dissection was considered, however given that patient has had symptoms ongoing for well over 3 days, with a normal chest x-ray, troponin x2 is not elevated, and reassuring EKG it is very unlikely.   I spoke with hospitalist who will see patient for admission. The patient appears reasonably stabilized for admission considering the current resources, flow, and capabilities available in the ED at this time, and I doubt any other [facility]  requiring further screening and/or treatment in the ED prior to admission assuming timely admission and bed placement.  Note: Portions of this report may have been transcribed using voice recognition software. Every effort was made to ensure accuracy; however, inadvertent computerized transcription errors may be present         Final Clinical Impression(s) / ED Diagnoses Final diagnoses:  Hypertensive emergency    Rx / DC Orders ED Discharge Orders     None         Cristina Gong, PA-C 04/14/22 2218    Rozelle Logan, DO 04/15/22 2336

## 2022-04-14 NOTE — ED Provider Notes (Signed)
[facility] - [facility]   MRN: [id number] DOB: 12-15-1975  Subjective:   Jeanette Petersen is a 46 y.o. female presenting for check on her blood pressure.  Patient has previously had this diagnosis but has never taken blood pressure medicine despite being prescribed medication many times before.  She does not have a regular doctor.  Patient states that she is not from here, does not have a PCP.  No personal history of stroke, heart disease, aneurysm.  However, she does endorse a history of head trauma, she is a smoker.  No active chest pain, shortness of breath, weakness, numbness or tingling, nausea, vomiting, abdominal pain, hematuria.  No history of migraines.  She does have a moderate to severe headache over the frontal and back of her head.  Has had some intermittent photophobia.  Had blurred vision yesterday but not today.  No current facility-administered medications for this encounter.  Current Outpatient Medications:    amLODipine (NORVASC) 5 MG tablet, Take 1 tablet (5 mg total) by mouth daily., Disp: 30 tablet, Rfl: 0   No Known Allergies  History reviewed. No pertinent past medical history.   History reviewed. No pertinent surgical history.  Family History  Family history unknown: Yes    Social History   Tobacco Use   Smoking status: Former    Types: Cigarettes   Smokeless tobacco: Former  Substance Use Topics   Alcohol use: Never   Drug use: Never    ROS   Objective:   Vitals: BP (!) 170/121   Pulse 90   Temp 98.1 F (36.7 C)   Resp 20   LMP 03/28/2022   SpO2 100%   Physical Exam Constitutional:      General: She is not in acute distress.    Appearance: Normal appearance. She is well-developed. She is not ill-appearing, toxic-appearing or diaphoretic.  HENT:     Head: Normocephalic and atraumatic.     Nose: Nose normal.     Mouth/Throat:     Mouth: Mucous membranes are moist.  Eyes:     General: No scleral icterus.       Right eye: No  discharge.        Left eye: No discharge.     Extraocular Movements: Extraocular movements intact.  Neck:     Meningeal: Brudzinski's sign and Kernig's sign absent.  Cardiovascular:     Rate and Rhythm: Normal rate and regular rhythm.     Heart sounds: Normal heart sounds. No murmur heard.   No friction rub. No gallop.  Pulmonary:     Effort: Pulmonary effort is normal. No respiratory distress.     Breath sounds: No stridor. No wheezing, rhonchi or rales.  Chest:     Chest wall: No tenderness.  Skin:    General: Skin is warm and dry.  Neurological:     General: No focal deficit present.     Mental Status: She is alert and oriented to person, place, and time.     Cranial Nerves: No cranial nerve deficit, dysarthria or facial asymmetry.     Motor: No weakness or pronator drift.     Coordination: Romberg sign negative. Coordination normal. Finger-Nose-Finger Test and Heel to [facility] Test normal. Rapid alternating movements normal.     Gait: Gait and tandem walk normal.     Deep Tendon Reflexes: Reflexes normal.  Psychiatric:        Mood and Affect: Mood is anxious.        Thought  Content: Thought content normal.        Judgment: Judgment normal.     Comments: Tearful affect.    Assessment and Plan :   PDMP not reviewed this encounter.  1. Generalized headache   2. Essential hypertension   3. Hypertensive urgency    Discussed possibility of an acute encephalopathy and patient is not interested in going to the emergency room now.  She has no abnormal neurologic findings and therefore I will defer calling EMS.  Emphasized need to maintain strict ER precautions.  Start amlodipine.  Practice a hypertensive friendly diet.  She was given Tylenol in clinic for her headache.  Emphasized that she is not to use any NSAIDs and is to use Tylenol at home for her headaches. Counseled patient on potential for adverse effects with medications prescribed today, patient verbalized understanding.     Wallis Bamberg, [place] 04/14/22 1617

## 2022-04-14 NOTE — ED Triage Notes (Signed)
Pt states she was seen at [facility] on [facility] and sent to ED for further eval.  Reports dizziness, headache, and chest pain since Thursday.    Also seen on 5/22 for BP but can't afford her BP medication.

## 2022-04-14 NOTE — ED Notes (Signed)
PT ambulatory to bathroom with no noted difficulties  ?

## 2022-04-14 NOTE — Discharge Instructions (Addendum)
I did send the prescription for amlodipine to your pharmacy. Take this every day. You are refusing to go to the emergency room now. However, if you continue to have a headache even though you are taking Tylenol and the blood pressure medicine then please go to the emergency room as this can be a sign of stroke or an aneurysm. Do not use any nonsteroidal anti-inflammatories (NSAIDs) like ibuprofen, Motrin, naproxen, Aleve, etc. which are all available over-the-counter.  Please just use Tylenol at a dose of 500mg -650mg  once every 6 hours as needed for your aches, pains, fevers.   For diabetes or elevated blood sugar, please make sure you are limiting and avoiding starchy, carbohydrate foods like pasta, breads, sweet breads, pastry, rice, potatoes, desserts. These foods can elevate your blood sugar. Also, limit and avoid drinks that contain a lot of sugar such as sodas, sweet teas, fruit juices.  Drinking plain water will be much more helpful, try 64 ounces of water daily.  It is okay to flavor your water naturally by cutting cucumber, lemon, mint or lime, placing it in a picture with water and drinking it over a period of 24-48 hours as long as it remains refrigerated.  For elevated blood pressure, make sure you are monitoring salt in your diet.  Do not eat restaurant foods and limit processed foods at home. I highly recommend you prepare and cook your own foods at home.  Processed foods include things like frozen meals, pre-seasoned meats and dinners, deli meats, canned foods as these foods contain a high amount of sodium/salt.  Make sure you are paying attention to sodium labels on foods you buy at the grocery store. Buy your spices separately such as garlic powder, onion powder, cumin, cayenne, parsley flakes so that you can avoid seasonings that contain salt. However, salt-free seasonings are available and can be used, an example is Mrs. Dash and includes a lot of different mixtures that do not contain  salt.  Lastly, when cooking using oils that are healthier for you is important. This includes olive oil, avocado oil, canola oil. We have discussed a lot of foods to avoid but below is a list of foods that can be very healthy to use in your diet whether it is for diabetes, cholesterol, high blood pressure, or in general healthy eating.  Salads - kale, spinach, cabbage, spring mix, arugula Fruits - avocadoes, berries (blueberries, raspberries, blackberries), apples, oranges, pomegranate, grapefruit, kiwi Vegetables - asparagus, cauliflower, broccoli, green beans, brussel sprouts, bell peppers, beets; stay away from or limit starchy vegetables like potatoes, carrots, peas Other general foods - kidney beans, egg whites, almonds, walnuts, sunflower seeds, pumpkin seeds, fat free yogurt, almond milk, flax seeds, quinoa, oats  Meat - It is better to eat lean meats and limit your red meat including pork to once a week.  Wild caught fish, chicken breast are good options as they tend to be leaner sources of good protein. Still be mindful of the sodium labels for the meats you buy.  DO NOT EAT ANY FOODS ON THIS LIST THAT YOU ARE ALLERGIC TO. For more specific needs, I highly recommend consulting a dietician or nutritionist but this can definitely be a good starting point.

## 2022-04-14 NOTE — ED Triage Notes (Signed)
Pt here with throbbing headache starting at the base of her neck, dizziness and HTN x 4 days. Pt was seen on the 22nd, but cannot afford her Amlodipine Rx.

## 2022-04-14 NOTE — H&P (Addendum)
History and Physical    [name] [id number] DOB: 06/25/1976 DOA: 04/14/2022  PCP: Pcp, No  Patient coming from: Home  Chief Complaint: Headache  HPI: Jeanette Petersen is a 46 y.o. female with medical history significant of hypertension during pregnancy.  She was seen at urgent care 6 days ago after she was noted to have elevated blood pressure readings as high as 190s over 120s when she went to donate plasma but was not on any medications.  She was started on amlodipine 5 mg daily.  Patient was not able to afford amlodipine and returned to the ED today complaining of headache, dizziness, and chest pain.  Blood pressure 190/112 on arrival.  Labs showing WBC 12.3, hemoglobin 16.5, platelet count 313k.  Sodium 136, potassium 3.6, chloride 101, bicarb 25, BUN 7, creatinine 0.8, glucose 102.  High-sensitivity troponin negative x2.  Chest x-ray normal.  CT head negative for acute intraabnormality; showing partially empty sella which is often a normal anatomic variant but can be associated with idiopathic intracranial hypertension.  Also showing opacified left frontal sinus.  CTA head and neck negative for LVO. Patient was given IV hydralazine 10 mg in the ED after which blood pressure improved significantly.  Neurology consulted and felt that her symptoms are likely due to TIA in the setting of hypertensive emergency.  Patient states she was diagnosed with hypertension during pregnancy 8 years ago and was prescribed a medication but she only took 1 dose.  For the past week her blood pressure has been running high in the 190s over 120s to 130s.  She was seen at urgent care 6 days ago and prescribed amlodipine but was not able to pick it up due to cost issues.  For the past 3 days she is having severe headaches, photophobia, and dizziness.  Reports decreased sensation in her left upper and lower extremity.  She was also having left-sided chest pain for the past few days which has resolved.  Denies shortness of  breath.  Denies nausea, vomiting, or abdominal pain.    Review of Systems:  Review of Systems  All other systems reviewed and are negative.  Past Medical History:  Diagnosis Date   Hypertension     History reviewed. No pertinent surgical history.   reports that she has been smoking cigarettes. She has quit using smokeless tobacco. She reports that she does not drink alcohol and does not use drugs.  No Known Allergies  Family History  Family history unknown: Yes    Prior to Admission medications   Medication Sig Start Date End Date Taking? Authorizing Provider  acetaminophen (TYLENOL) 325 MG tablet Take 2 tablets (650 mg total) by mouth every 6 (six) hours as needed for moderate pain or headache. 04/14/22   Jaynee Eagles, PA-C  amLODipine (NORVASC) 5 MG tablet Take 1 tablet (5 mg total) by mouth daily. 04/14/22   Jaynee Eagles, PA-C    Physical Exam: Vitals:   04/14/22 1932 04/14/22 1945 04/14/22 2030 04/14/22 2100  BP:   136/60 139/79  Pulse:  65 90 96  Resp: 18 17    Temp:      TempSrc:      SpO2:  98% 97% 98%    Physical Exam Vitals reviewed.  Constitutional:      General: She is not in acute distress. HENT:     Head: Normocephalic and atraumatic.  Eyes:     Extraocular Movements: Extraocular movements intact.     Conjunctiva/sclera: Conjunctivae normal.  Cardiovascular:  Rate and Rhythm: Normal rate and regular rhythm.     Pulses: Normal pulses.  Pulmonary:     Effort: Pulmonary effort is normal. No respiratory distress.     Breath sounds: Normal breath sounds. No wheezing or rales.  Abdominal:     General: Bowel sounds are normal. There is no distension.     Palpations: Abdomen is soft.     Tenderness: There is no abdominal tenderness.  Musculoskeletal:        General: No swelling or tenderness.     Cervical back: Normal range of motion and neck supple.  Skin:    General: Skin is warm and dry.  Neurological:     Mental Status: She is alert and oriented  to person, place, and time.     Cranial Nerves: No cranial nerve deficit.     Sensory: Sensory deficit present.     Motor: Weakness present.     Comments: Diminished sensation to light touch in the left upper and lower extremities Strength mildly reduced in the left upper and lower extremities     Labs on Admission: I have personally reviewed following labs and imaging studies  CBC: Recent Labs  Lab 04/14/22 1647  WBC 12.3*  HGB 16.5*  HCT 50.4*  MCV 91.5  PLT [id number]   Basic Metabolic Panel: Recent Labs  Lab 04/14/22 1647  NA 136  K 3.6  CL 101  CO2 25  GLUCOSE 102*  BUN 7  CREATININE 0.83  CALCIUM 9.8   GFR: CrCl cannot be calculated (Unknown ideal weight.). Liver Function Tests: Recent Labs  Lab 04/14/22 1917  AST 16  ALT 18  ALKPHOS 62  BILITOT 0.5  PROT 7.2  ALBUMIN 3.5   Recent Labs  Lab 04/14/22 1917  LIPASE 29   No results for input(s): AMMONIA in the last 168 hours. Coagulation Profile: No results for input(s): INR, PROTIME in the last 168 hours. Cardiac Enzymes: No results for input(s): CKTOTAL, CKMB, CKMBINDEX, TROPONINI in the last 168 hours. BNP (last 3 results) No results for input(s): PROBNP in the last 8760 hours. HbA1C: No results for input(s): HGBA1C in the last 72 hours. CBG: No results for input(s): GLUCAP in the last 168 hours. Lipid Profile: No results for input(s): CHOL, HDL, LDLCALC, TRIG, CHOLHDL, LDLDIRECT in the last 72 hours. Thyroid Function Tests: No results for input(s): TSH, T4TOTAL, FREET4, T3FREE, THYROIDAB in the last 72 hours. Anemia Panel: No results for input(s): VITAMINB12, FOLATE, FERRITIN, TIBC, IRON, RETICCTPCT in the last 72 hours. Urine analysis: No results found for: COLORURINE, APPEARANCEUR, Barrett, Arnett, GLUCOSEU, HGBUR, BILIRUBINUR, KETONESUR, PROTEINUR, UROBILINOGEN, NITRITE, LEUKOCYTESUR  Radiological Exams on Admission: I have personally reviewed images CT ANGIO HEAD NECK W WO CM  Result Date:  04/14/2022 CLINICAL DATA:  Severe hypertension, left-sided weakness, dizziness, headache EXAM: CT ANGIOGRAPHY HEAD AND NECK TECHNIQUE: Multidetector CT imaging of the head and neck was performed using the standard protocol during bolus administration of intravenous contrast. Multiplanar CT image reconstructions and MIPs were obtained to evaluate the vascular anatomy. Carotid stenosis measurements (when applicable) are obtained utilizing NASCET criteria, using the distal internal carotid diameter as the denominator. RADIATION DOSE REDUCTION: This exam was performed according to the departmental dose-optimization program which includes automated exposure control, adjustment of the mA and/or kV according to patient size and/or use of iterative reconstruction technique. CONTRAST:  74mL OMNIPAQUE IOHEXOL 350 MG/ML SOLN COMPARISON:  No prior CTA, correlation made with CT head 04/14/2022 FINDINGS: CT HEAD FINDINGS For noncontrast  findings, please see same day CT head. CTA NECK FINDINGS Aortic arch: Two-vessel arch with a common origin of the brachiocephalic and left common carotid arteries. Imaged portion shows no evidence of aneurysm or dissection. No significant stenosis of the major arch vessel origins. Right carotid system: No evidence of dissection, occlusion, or hemodynamically significant stenosis (greater than 50%). Left carotid system: No evidence of dissection, occlusion, or hemodynamically significant stenosis (greater than 50%). Vertebral arteries: Evaluation of the proximal vertebral arteries is somewhat limited by photon starvation from overlying soft tissues. Within this limitation, no evidence of dissection, occlusion, or hemodynamically significant stenosis (greater than 50%). Skeleton: No acute osseous abnormality. Other neck: No acute finding. Upper chest: No focal pulmonary opacity or pleural effusion. Review of the MIP images confirms the above findings CTA HEAD FINDINGS Evaluation is somewhat limited  by bolus timing. Anterior circulation: Both internal carotid arteries are patent to the termini, without significant stenosis. A1 segments patent. Normal anterior communicating artery. Mild irregularity in the right A2 and A3 segments. Anterior cerebral arteries are otherwise patent to their distal aspects. No M1 stenosis or occlusion. Normal MCA bifurcations. Distal MCA branches perfused and symmetric. Posterior circulation: Vertebral arteries patent to the vertebrobasilar junction without stenosis. Posterior inferior cerebellar artery patent on the left. Likely a right AICA. Basilar patent to its distal aspect. Superior cerebellar arteries patent proximally. Patent P1 segments. PCAs perfused to their distal aspects without stenosis. The bilateral posterior communicating arteries are not visualized. Venous sinuses: As permitted by contrast timing, patent. Anatomic variants: None significant. Review of the MIP images confirms the above findings IMPRESSION: 1.  No intracranial large vessel occlusion or significant stenosis. 2.  No hemodynamically significant stenosis in the neck. Electronically Signed   By: Merilyn Baba M.D.   On: 04/14/2022 20:56   DG Chest 2 View  Result Date: 04/14/2022 CLINICAL DATA:  Chest pain EXAM: CHEST - 2 VIEW COMPARISON:  None Available. FINDINGS: Lungs are clear.  No pleural effusion or pneumothorax. Heart is normal in size. Visualized osseous structures are within normal limits. IMPRESSION: Normal chest radiographs. Electronically Signed   By: Julian Hy M.D.   On: 04/14/2022 18:03   CT HEAD WO CONTRAST (5MM)  Result Date: 04/14/2022 CLINICAL DATA:  Headache, new or worsening, neuro deficit (Age 37-49y) EXAM: CT HEAD WITHOUT CONTRAST TECHNIQUE: Contiguous axial images were obtained from the base of the skull through the vertex without intravenous contrast. RADIATION DOSE REDUCTION: This exam was performed according to the departmental dose-optimization program which  includes automated exposure control, adjustment of the mA and/or kV according to patient size and/or use of iterative reconstruction technique. COMPARISON:  None Available. FINDINGS: Brain: No evidence of acute infarction, hemorrhage, hydrocephalus, extra-axial collection or mass lesion/mass effect. Partially empty sella. Vascular: No hyperdense vessel identified. Skull: No acute fracture. Sinuses/Orbits: Opacified left frontal sinus. Otherwise, clear sinuses. No acute orbital findings. Other: Trace right mastoid effusion. IMPRESSION: 1. No evidence of acute abnormality. 2. Partially empty sella, which is often a normal anatomic variant but can be associated with idiopathic intracranial hypertension (pseudotumor cerebri). 3. Opacified left frontal sinus. Electronically Signed   By: Margaretha Sheffield M.D.   On: 04/14/2022 18:39    EKG: Independently reviewed.  Sinus arrhythmia, no prior tracing for comparison.  Assessment and Plan  Probable TIA Patient with uncontrolled untreated hypertension presenting with headaches, photophobia, and dizziness.  On exam, left upper and lower extremities with diminished sensation to light touch and strength mildly reduced. Blood pressure 190/112  on arrival.  CT head negative for acute intraabnormality; showing partially empty sella which is often a normal anatomic variant but can be associated with idiopathic intracranial hypertension. CTA head and neck negative for LVO. Neurology consulted and felt that her symptoms are likely due to TIA in the setting of hypertensive emergency. -Appreciate neurology recommendations -Target blood pressure <180/<120 mmHg for the first hour, then <160/<110 mmHg over the next 23 hours. -MRI brain without contrast -Echocardiogram -A1c, lipid panel -Start statin if LDL >70 -Start aspirin 81 mg daily -Start Plavix 75 mg daily x3 weeks -Telemetry monitoring -Bedside swallow screen -Stroke education -PT/OT/SLP consult  Hypertensive  emergency -Target blood pressure <180/<120 mmHg for the first hour, then <160/<110 mmHg over the next 23 hours. -Amlodipine 5 mg daily -IV hydralazine PRN  Chest pain ACS less likely as high-sensitivity troponin negative x2.  Chest x-ray normal.  PE less likely given no tachycardia or hypoxia.  Not having active chest pain at this time. -Continue to monitor  Opacified left frontal sinus on CT Discussed with the patient, she is not having any nasal congestion or discharge.  No fevers.  DVT prophylaxis: Lovenox Code Status: Full Code Family Communication: No family available at this time.  Addendum 04/14/2022 at 10:54 PM: Patient's husband at bedside and updated.  Consults called: Neurology Level of care: Progressive Care Unit Admission status: It is my clinical opinion that referral for OBSERVATION is reasonable and necessary in this patient based on the above information provided. The aforementioned taken together are felt to place the patient at high risk for further clinical deterioration. However, it is anticipated that the patient may be medically stable for discharge from the hospital within 24 to 48 hours.   Shela Leff MD [facility]  If 7PM-7AM, please contact night-coverage [email or web address]  04/14/2022, 9:47 PM

## 2022-04-14 NOTE — ED Notes (Signed)
Patient's visitor given recliner.  

## 2022-04-14 NOTE — ED Notes (Signed)
Patient transported to X-ray 

## 2022-04-15 ENCOUNTER — Observation Stay (HOSPITAL_COMMUNITY): Payer: Self-pay

## 2022-04-15 ENCOUNTER — Observation Stay (HOSPITAL_BASED_OUTPATIENT_CLINIC_OR_DEPARTMENT_OTHER): Payer: Self-pay

## 2022-04-15 DIAGNOSIS — R519 Headache, unspecified: Secondary | ICD-10-CM | POA: Diagnosis present

## 2022-04-15 DIAGNOSIS — G459 Transient cerebral ischemic attack, unspecified: Secondary | ICD-10-CM

## 2022-04-15 LAB — RAPID URINE DRUG SCREEN, HOSP PERFORMED
Amphetamines: NOT DETECTED
Barbiturates: NOT DETECTED
Benzodiazepines: POSITIVE — AB
Cocaine: NOT DETECTED
Opiates: NOT DETECTED
Tetrahydrocannabinol: POSITIVE — AB

## 2022-04-15 LAB — LIPID PANEL
Cholesterol: 203 mg/dL — ABNORMAL HIGH (ref 0–200)
HDL: 55 mg/dL (ref >40)
LDL Cholesterol: 131 mg/dL — ABNORMAL HIGH (ref 0–99)
Total CHOL/HDL Ratio: 3.7 RATIO
Triglycerides: 86 mg/dL (ref <150)
VLDL: 17 mg/dL (ref 0–40)

## 2022-04-15 LAB — ECHOCARDIOGRAM COMPLETE
Area-P 1/2: 4.8 cm2
S' Lateral: 3.4 cm

## 2022-04-15 LAB — CBC
HCT: 46.8 % — ABNORMAL HIGH (ref 36.0–46.0)
Hemoglobin: 16.1 g/dL — ABNORMAL HIGH (ref 12.0–15.0)
MCH: 31.1 pg (ref 26.0–34.0)
MCHC: 34.4 g/dL (ref 30.0–36.0)
MCV: 90.5 fL (ref 80.0–100.0)
Platelets: 277 10*3/uL (ref 150–400)
RBC: 5.17 MIL/uL — ABNORMAL HIGH (ref 3.87–5.11)
RDW: 14 % (ref 11.5–15.5)
WBC: 9.8 10*3/uL (ref 4.0–10.5)
nRBC: 0 % (ref 0.0–0.2)

## 2022-04-15 LAB — HEMOGLOBIN A1C
Hgb A1c MFr Bld: 5.8 % — ABNORMAL HIGH (ref 4.8–5.6)
Mean Plasma Glucose: 119.76 mg/dL

## 2022-04-15 IMAGING — MR MR MRV HEAD W/O CM
2 series · 19 of 48 positions shown · non-contrast
Comparison: No prior MRI or MRV; correlation is made with CT head
and CTA head neck [DATE]

CLINICAL DATA: Headache

EXAM:
MRI HEAD WITHOUT CONTRAST
MRV HEAD WITHOUT CONTRAST
TECHNIQUE: Multiplanar, multi-echo pulse sequences of the brain and surrounding
structures were acquired without intravenous contrast. Angiographic
images of the intracranial venous structures were acquired using MRV
technique without intravenous contrast.

[Series 11: MRV · coronal · 1.5mm · 0.43mm/px · 9 of 110 slices shown]
[im 1/110]
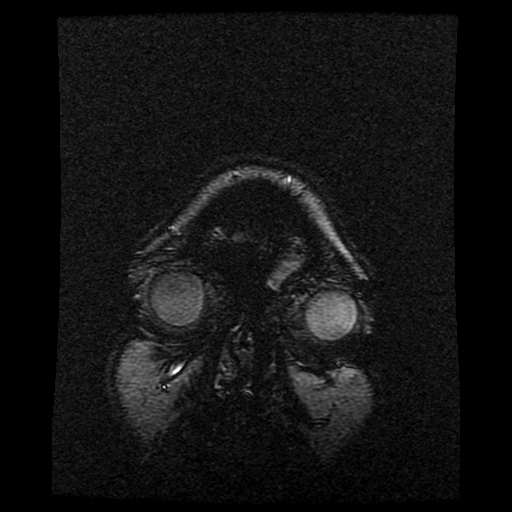
[im 19/110]
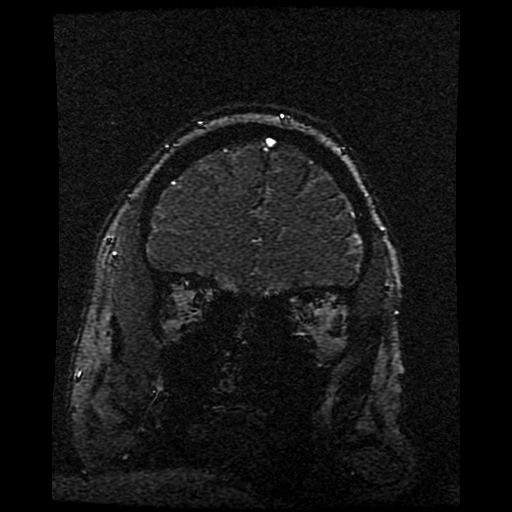
[im 37/110]
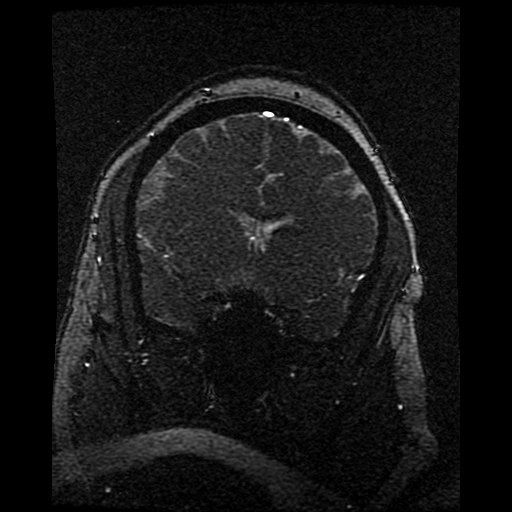
[im 46/110]
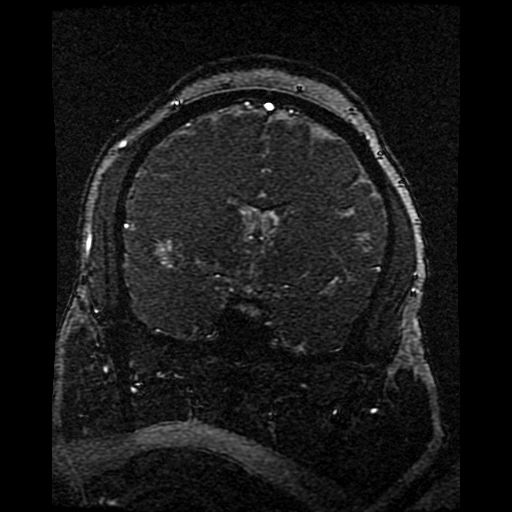
[im 55/110]
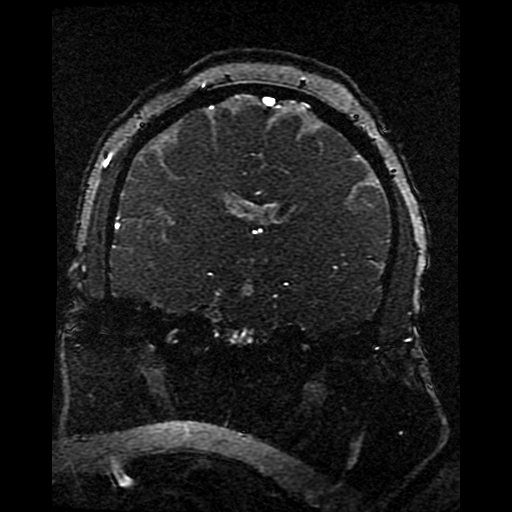
[im 64/110]
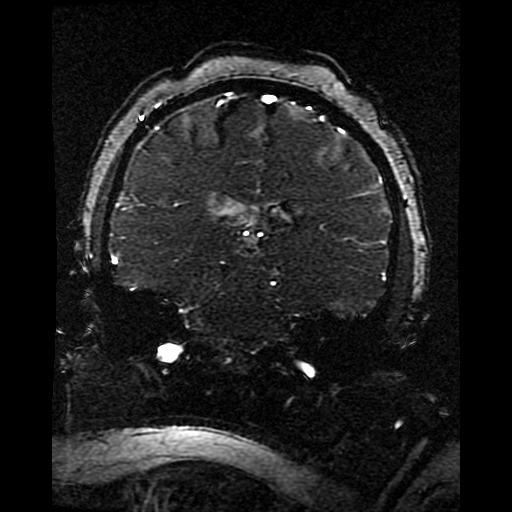
[im 73/110]
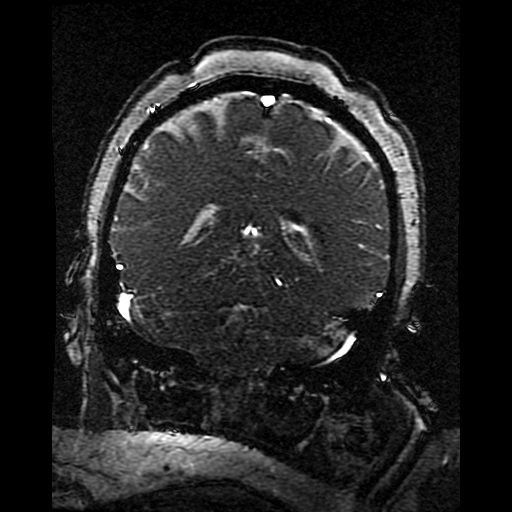
[im 91/110]
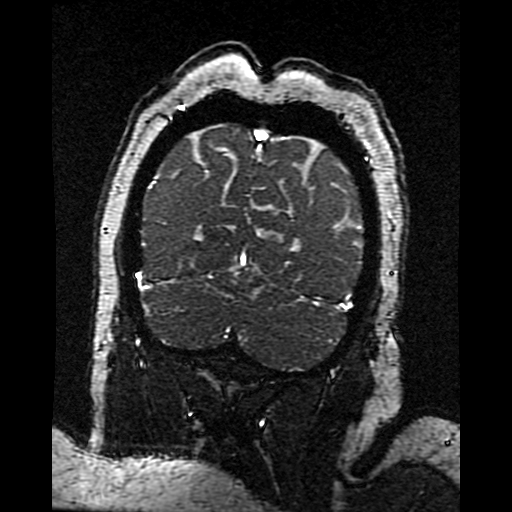
[im 110/110]
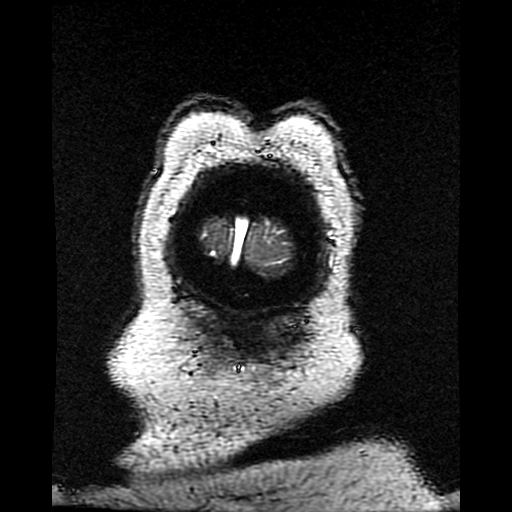

[Series 12: sag inhance (id) · sagittal · 1.8mm · 0.47mm/px · 10 of 299 slices shown]
[im 18/299]
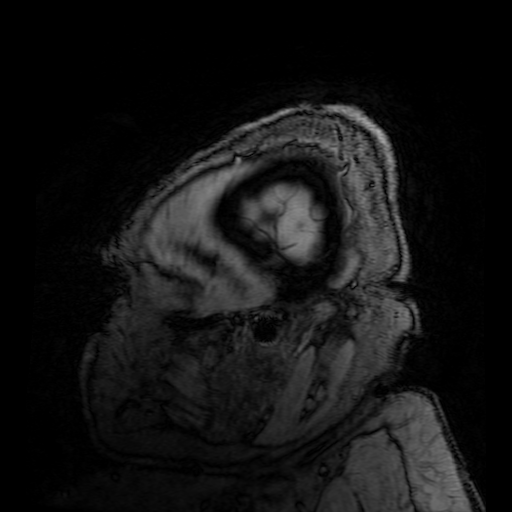
[im 44/299]
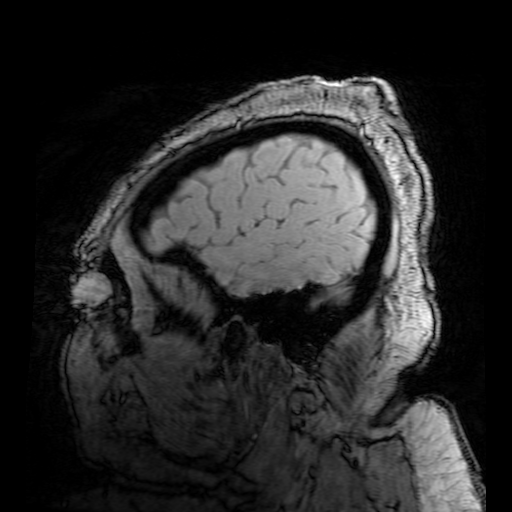
[im 53/299]
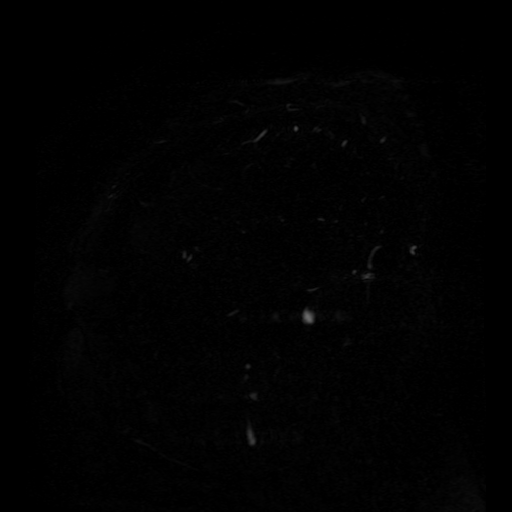
[im 88/299]
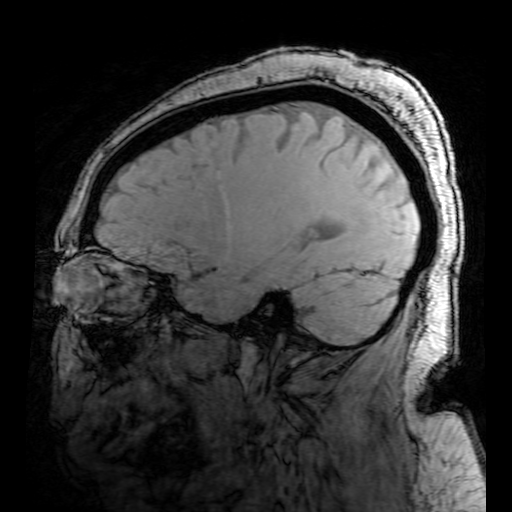
[im 132/299]
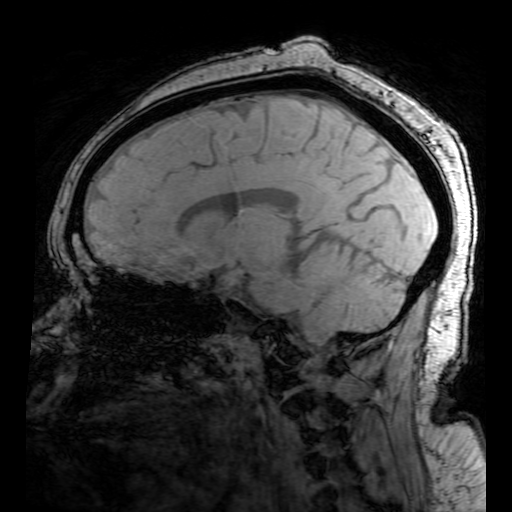
[im 150/299]
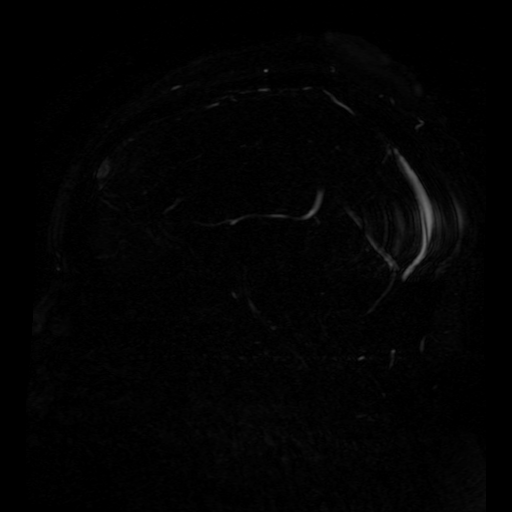
[im 167/299]
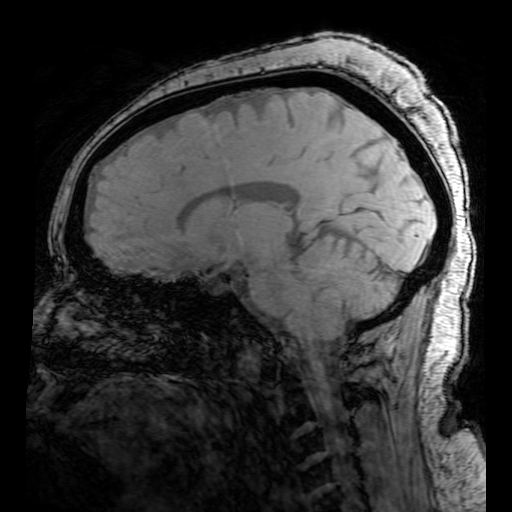
[im 211/299]
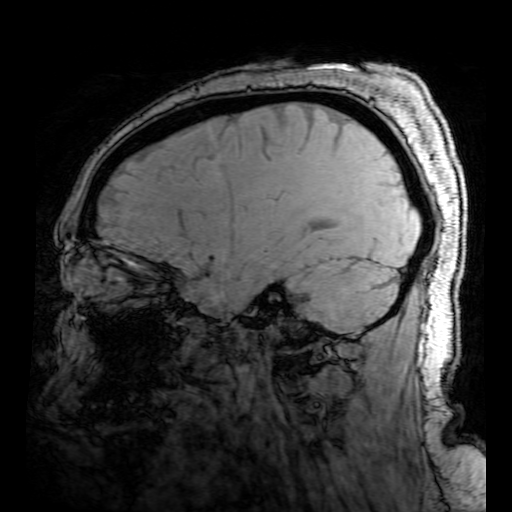
[im 246/299]
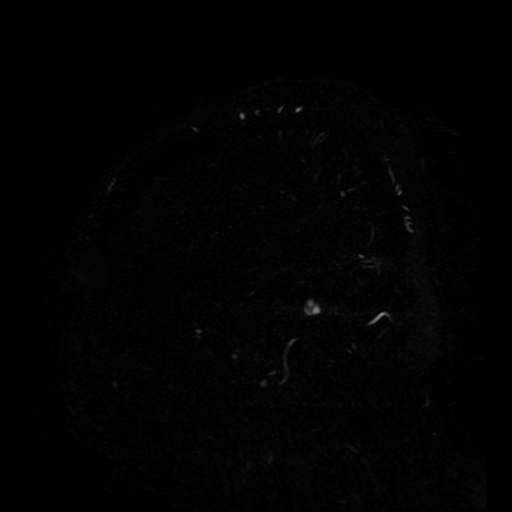
[im 255/299]
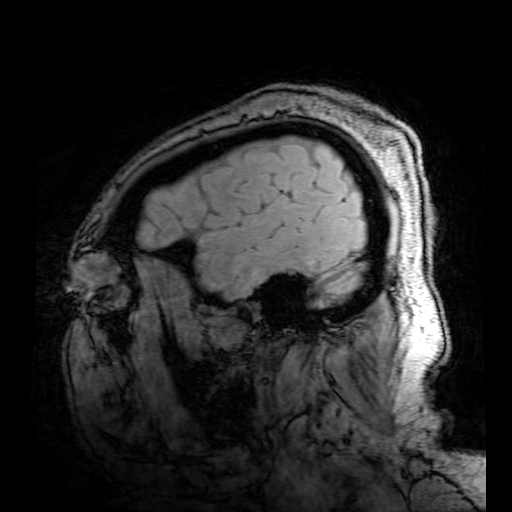

[19 of 48 positions shown; findings below may reference images not displayed]

FINDINGS: MRI HEAD WITHOUT CONTRAST

Brain: No restricted diffusion to suggest acute or subacute infarct.
No acute hemorrhage, mass, mass effect, or midline shift. No
hemosiderin deposition to suggest remote hemorrhage. No
hydrocephalus or extra-axial collection.

Vascular: Normal arterial flow voids.

Skull and upper cervical spine: Normal marrow signal.

Sinuses/Orbits: Mucosal thickening in the left frontal sinus and
left anterior ethmoid air cells. Otherwise clear. The orbits are
unremarkable.

Other: Fluid in the right-greater-than-left mastoid air cells.

MR VENOGRAM WITHOUT CONTRAST

There is no evidence of dural venous sinus or deep cerebral vein
thrombosis. The right transverse and sigmoid sinus are dominant, and
the left transverse and sigmoid sinus are hypoplastic. No dural
venous sinus stenosis.
IMPRESSION: 1.  No acute intracranial process.
2. No evidence of dural venous sinus thrombosis or stenosis.

## 2022-04-15 IMAGING — MR MR HEAD W/O CM
6 of 10 series · 29 of 48 positions shown · non-contrast
Comparison: No prior MRI or MRV; correlation is made with CT head
and CTA head neck [DATE]

CLINICAL DATA: Headache

EXAM:
MRI HEAD WITHOUT CONTRAST
MRV HEAD WITHOUT CONTRAST
TECHNIQUE: Multiplanar, multi-echo pulse sequences of the brain and surrounding
structures were acquired without intravenous contrast. Angiographic
images of the intracranial venous structures were acquired using MRV
technique without intravenous contrast.

[Series 3: DWI · axial · 3.0mm · 0.94mm/px · z∈[-95,+41]mm · 9 of 99 slices shown (1 of 2)]
[im 1/99]
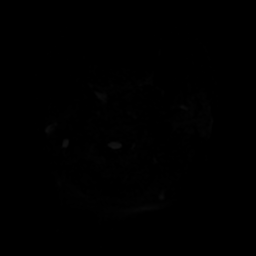
[im 13/99]
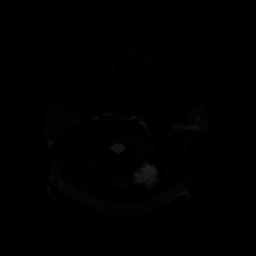
[im 25/99]
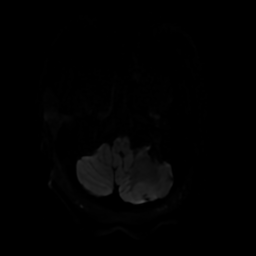
[im 37/99]
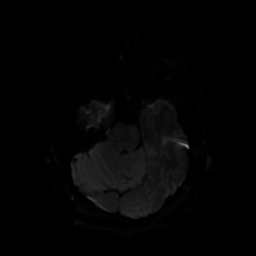
[im 50/99]
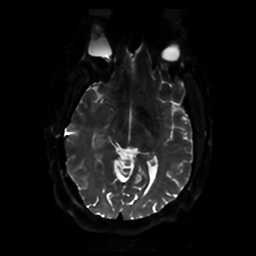
[im 62/99]
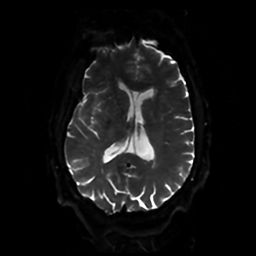
[im 74/99]
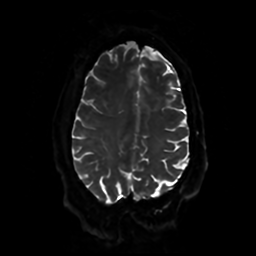
[im 86/99]
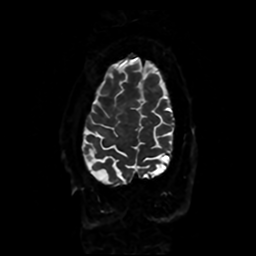
[im 99/99]
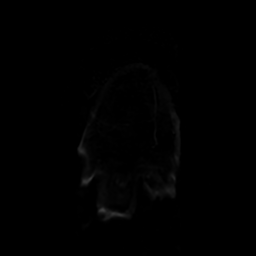

[Series 4: DWI · coronal · 4.0mm · 0.94mm/px · 7 of 72 slices shown (2 of 2)]
[im 1/72]
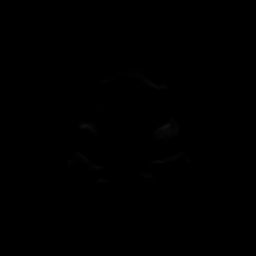
[im 12/72]
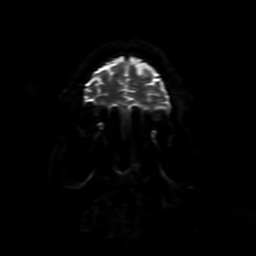
[im 24/72]
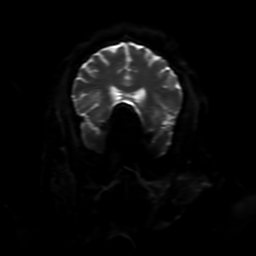
[im 36/72]
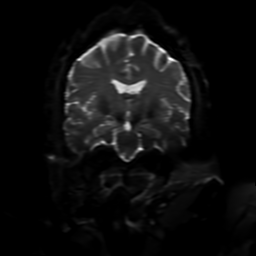
[im 48/72]
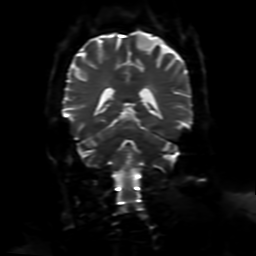
[im 60/72]
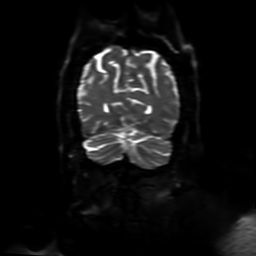
[im 72/72]
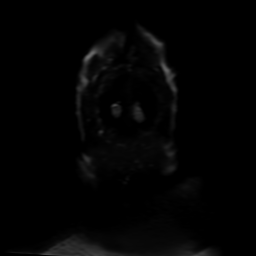

[Series 5: FLAIR · sagittal · 5.0mm · 0.23mm/px · 2 of 24 slices shown (1 of 2)]
[im 1/24]
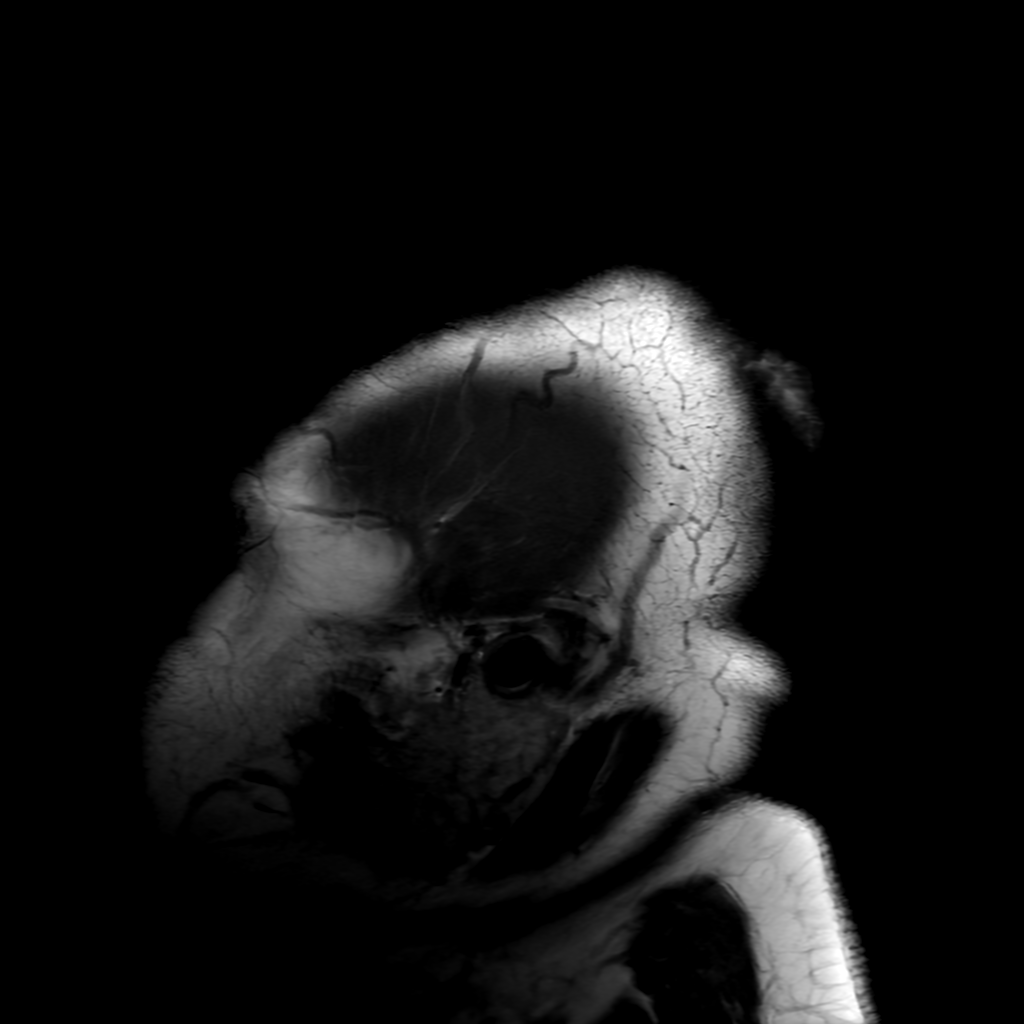
[im 24/24]
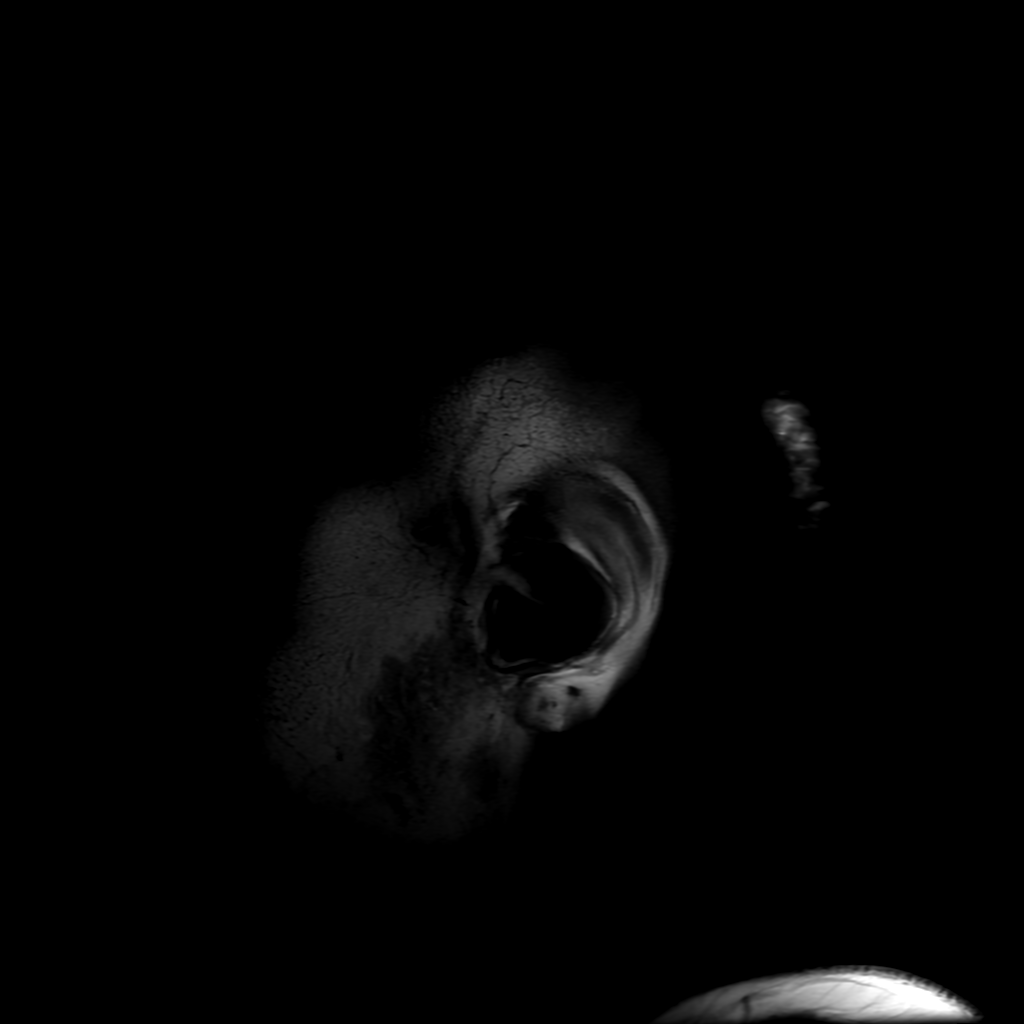

[Series 7: FLAIR · axial · 4.0mm · 0.45mm/px · z∈[-60,+69]mm · 3 of 32 slices shown (2 of 2)]
[im 1/32]
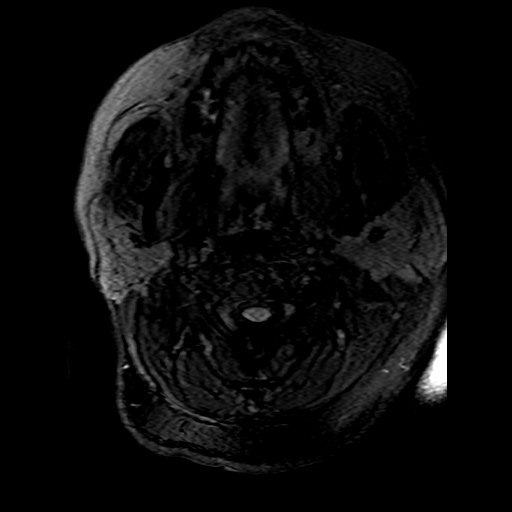
[im 16/32]
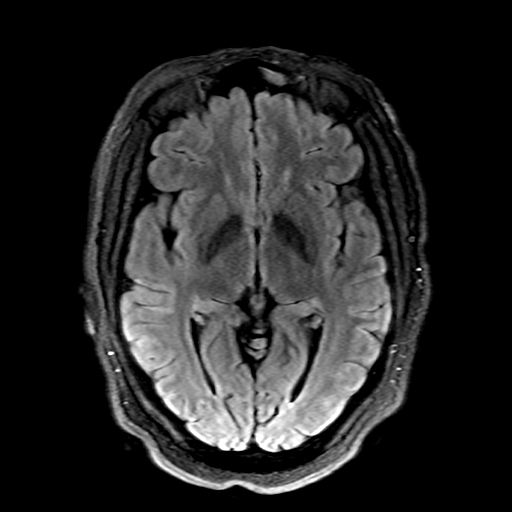
[im 32/32]
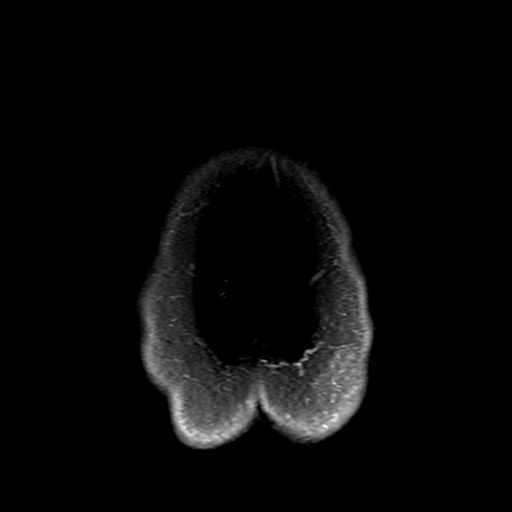

[Series 350: ADC · axial · 3.0mm · 0.94mm/px · z∈[-95,+41]mm · 5 of 50 slices shown (1 of 2)]
[im 1/50]
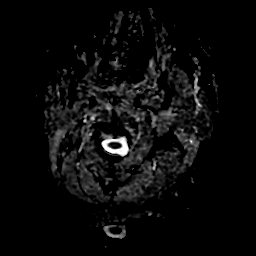
[im 13/50]
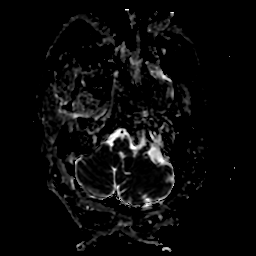
[im 25/50]
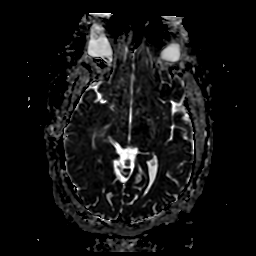
[im 37/50]
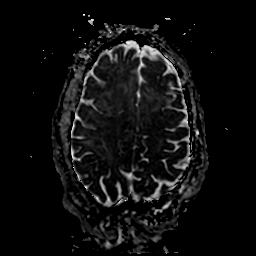
[im 50/50]
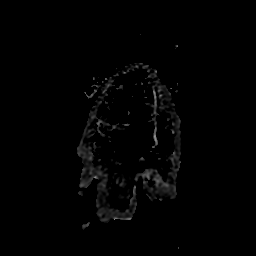

[Series 450: ADC · coronal · 4.0mm · 0.94mm/px · 3 of 36 slices shown (2 of 2)]
[im 1/36]
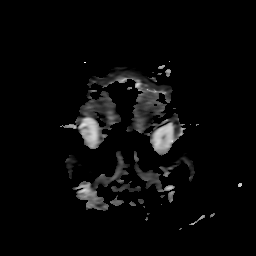
[im 18/36]
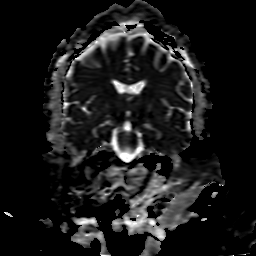
[im 36/36]
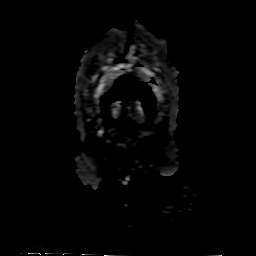

[29 of 48 positions shown; findings below may reference images not displayed]

FINDINGS: MRI HEAD WITHOUT CONTRAST

Brain: No restricted diffusion to suggest acute or subacute infarct.
No acute hemorrhage, mass, mass effect, or midline shift. No
hemosiderin deposition to suggest remote hemorrhage. No
hydrocephalus or extra-axial collection.

Vascular: Normal arterial flow voids.

Skull and upper cervical spine: Normal marrow signal.

Sinuses/Orbits: Mucosal thickening in the left frontal sinus and
left anterior ethmoid air cells. Otherwise clear. The orbits are
unremarkable.

Other: Fluid in the right-greater-than-left mastoid air cells.

MR VENOGRAM WITHOUT CONTRAST

There is no evidence of dural venous sinus or deep cerebral vein
thrombosis. The right transverse and sigmoid sinus are dominant, and
the left transverse and sigmoid sinus are hypoplastic. No dural
venous sinus stenosis.
IMPRESSION: 1.  No acute intracranial process.
2. No evidence of dural venous sinus thrombosis or stenosis.

## 2022-04-15 MED ORDER — ROSUVASTATIN CALCIUM 20 MG PO TABS
20.0000 mg | ORAL_TABLET | Freq: Every day | ORAL | Status: DC
  Administered 2022-04-15 – 2022-04-16 (×2): 20 mg via ORAL
  Filled 2022-04-15 (×2): qty 1

## 2022-04-15 MED ORDER — LORAZEPAM 2 MG/ML IJ SOLN
0.5000 mg | Freq: Once | INTRAMUSCULAR | Status: AC | PRN
  Administered 2022-04-15: 0.5 mg via INTRAVENOUS
  Filled 2022-04-15: qty 1

## 2022-04-15 MED ORDER — LORAZEPAM 2 MG/ML IJ SOLN: 1.0000 mg | Freq: Once | INTRAMUSCULAR | Status: DC

## 2022-04-15 MED ORDER — LORAZEPAM 2 MG/ML IJ SOLN
0.5000 mg | Freq: Once | INTRAMUSCULAR | Status: AC
  Administered 2022-04-15: 0.5 mg via INTRAVENOUS
  Filled 2022-04-15: qty 1

## 2022-04-15 MED ORDER — LORAZEPAM 2 MG/ML IJ SOLN
2.0000 mg | Freq: Once | INTRAMUSCULAR | Status: AC
  Administered 2022-04-15: 2 mg via INTRAVENOUS
  Filled 2022-04-15: qty 1

## 2022-04-15 MED ORDER — CLOPIDOGREL BISULFATE 75 MG PO TABS
75.0000 mg | ORAL_TABLET | Freq: Every day | ORAL | Status: DC
Start: 2022-04-15 — End: 2022-04-16
  Administered 2022-04-15: 75 mg via ORAL
  Filled 2022-04-15: qty 1

## 2022-04-15 NOTE — Progress Notes (Signed)
SLP Cancellation Note  Patient Details Name: Jeanette Petersen MRN: [id number] DOB: Aug 20, 1976   Cancelled treatment:       Reason Eval/Treat Not Completed: SLP screened, no needs identified, will sign off   Blenda Mounts Laurice 04/15/2022, 2:57 PM

## 2022-04-15 NOTE — ED Notes (Signed)
Rounding on patient to get labs and explain to patient how important the labs were.patient refused and stated that she was going home .

## 2022-04-15 NOTE — Progress Notes (Signed)
PT Cancellation Note  Patient Details Name: Jeanette Petersen MRN: [id number] DOB: [birth year]   Cancelled Treatment:    Reason Eval/Treat Not Completed: PT screened, no needs identified, will sign off Per OT, pt at baseline with no skilled PT needs. If needs change, please re-consult.   [name], DPT  [facility] Services  Office: [phone]    [name] 04/15/2022, 3:37 PM

## 2022-04-15 NOTE — ED Notes (Signed)
Pt refuse lab work, Charity fundraiser notified

## 2022-04-15 NOTE — ED Notes (Signed)
Attempted to draw morning labs from pt. Pt refused.

## 2022-04-15 NOTE — ED Notes (Signed)
OT at bedside. 

## 2022-04-15 NOTE — ED Notes (Signed)
Patient transported to MRI 

## 2022-04-15 NOTE — ED Notes (Signed)
This RN made aware that patient was claustrophobic by MRI staff.  Made MD Rathore aware.

## 2022-04-15 NOTE — ED Notes (Signed)
Called MRI to check on status of pt getting her MRI and they are unable to give this RN an estimated time.

## 2022-04-15 NOTE — ED Notes (Signed)
Notified phlebotomy of need for labs drawn

## 2022-04-15 NOTE — Progress Notes (Signed)
PROGRESS NOTE  Jeanette Petersen  [id number] DOB: 1976-04-15 DOA: 04/14/2022 PCP: Pcp, No   Brief Narrative: Patient is a 46 year old female with history of hypertension who presented with complaint of headache.  She was recently started on amlodipine for hypertension.  On presentation she was hypertensive.  CT head was negative for acute intracranial findings.  CT head and neck were negative for LVO.  Patient remained hypertensive in the emergency department.  Patient's presenting symptom of headache, photophobia, dizziness was suspicious for TIA/stroke so patient was admitted for further management.  Neurology also consulted.  Pending MRI of the brain.  Assessment & Plan:  Principal Problem:   TIA (transient ischemic attack) Active Problems:   Hypertensive emergency   Chest pain  TIA: Presented with headache, photophobia, dizziness.  Also had left upper and lower extremity diminished sensation, mildly reduced strength.  CT head negative for acute intracranial abnormality.  CTA head and neck negative for LVO.  Neurology consulted. Plan for MRI.  Needs sedation because of claustrophobia.  Initially stroke/TIA work-up with echocardiogram, PT/OT/speech consulted. Started on aspirin, Plavix.  Hypertensive emergency: Recently diagnosed with hypertension, takes amlodipine.  Very hypertensive on presentation.  Monitor blood pressure.  Continue pain medications for severe hypertension  Chest pain: Troponins negative.  Chest x-ray did not show any acute problem.  Resolved  Leukocytosis: Mild.  Most likely reactive.  Continue to monitor  Ossified left frontal sinus on CT: No symptoms.  Continue to monitor  Obesity: Patient is morbidly obese        DVT prophylaxis:enoxaparin (LOVENOX) injection 40 mg Start: 04/14/22 2300     Code Status: Full Code  Family Communication: None at the bedside  Patient status:Obs  Patient is from :Home  Anticipated discharge NE:[id number]  Estimated DC  date:tomorrow, waiting for full work-up, needs neurology follow-up   Consultants: Neurology  Procedures: None yet  Antimicrobials:  Anti-infectives (From admission, onward)    None       Subjective: Patient seen and examined at the bedside this morning.  Hemodynamically stable.  Lying on the bed.  Headache better.  Still has some weakness on the left side.  Pending MRI.Alert and oriented  Objective: Vitals:   04/14/22 2330 04/15/22 0232 04/15/22 0245 04/15/22 0315  BP: (!) 115/43 (!) 169/98 (!) 151/98   Pulse: (!) 102 91  68  Resp: (!) 23 (!) 23 (!) 22 19  Temp:      TempSrc:      SpO2: 98% 100%  100%   No intake or output data in the 24 hours ending 04/15/22 0904 There were no vitals filed for this visit.  Examination:  General exam: Overall comfortable, not in distress, morbidly obese HEENT: PERRL Respiratory system:  no wheezes or crackles  Cardiovascular system: S1 & S2 heard, RRR.  Gastrointestinal system: Abdomen is nondistended, soft and nontender. Central nervous system: Alert and oriented, mild weakness on the left upper and lower extremity with motor 4/5 on both Extremities: No edema, no clubbing ,no cyanosis Skin: No rashes, no ulcers,no icterus     Data Reviewed: I have personally reviewed following labs and imaging studies  CBC: Recent Labs  Lab 04/14/22 1647  WBC 12.3*  HGB 16.5*  HCT 50.4*  MCV 91.5  PLT [id number]   Basic Metabolic Panel: Recent Labs  Lab 04/14/22 1647  NA 136  K 3.6  CL 101  CO2 25  GLUCOSE 102*  BUN 7  CREATININE 0.83  CALCIUM 9.8     No results  found for this or any previous visit (from the past 240 hour(s)).   Radiology Studies: CT ANGIO HEAD NECK W WO CM  Result Date: 04/14/2022 CLINICAL DATA:  Severe hypertension, left-sided weakness, dizziness, headache EXAM: CT ANGIOGRAPHY HEAD AND NECK TECHNIQUE: Multidetector CT imaging of the head and neck was performed using the standard protocol during bolus  administration of intravenous contrast. Multiplanar CT image reconstructions and MIPs were obtained to evaluate the vascular anatomy. Carotid stenosis measurements (when applicable) are obtained utilizing NASCET criteria, using the distal internal carotid diameter as the denominator. RADIATION DOSE REDUCTION: This exam was performed according to the departmental dose-optimization program which includes automated exposure control, adjustment of the mA and/or kV according to patient size and/or use of iterative reconstruction technique. CONTRAST:  4mL OMNIPAQUE IOHEXOL 350 MG/ML SOLN COMPARISON:  No prior CTA, correlation made with CT head 04/14/2022 FINDINGS: CT HEAD FINDINGS For noncontrast findings, please see same day CT head. CTA NECK FINDINGS Aortic arch: Two-vessel arch with a common origin of the brachiocephalic and left common carotid arteries. Imaged portion shows no evidence of aneurysm or dissection. No significant stenosis of the major arch vessel origins. Right carotid system: No evidence of dissection, occlusion, or hemodynamically significant stenosis (greater than 50%). Left carotid system: No evidence of dissection, occlusion, or hemodynamically significant stenosis (greater than 50%). Vertebral arteries: Evaluation of the proximal vertebral arteries is somewhat limited by photon starvation from overlying soft tissues. Within this limitation, no evidence of dissection, occlusion, or hemodynamically significant stenosis (greater than 50%). Skeleton: No acute osseous abnormality. Other neck: No acute finding. Upper chest: No focal pulmonary opacity or pleural effusion. Review of the MIP images confirms the above findings CTA HEAD FINDINGS Evaluation is somewhat limited by bolus timing. Anterior circulation: Both internal carotid arteries are patent to the termini, without significant stenosis. A1 segments patent. Normal anterior communicating artery. Mild irregularity in the right A2 and A3 segments.  Anterior cerebral arteries are otherwise patent to their distal aspects. No M1 stenosis or occlusion. Normal MCA bifurcations. Distal MCA branches perfused and symmetric. Posterior circulation: Vertebral arteries patent to the vertebrobasilar junction without stenosis. Posterior inferior cerebellar artery patent on the left. Likely a right AICA. Basilar patent to its distal aspect. Superior cerebellar arteries patent proximally. Patent P1 segments. PCAs perfused to their distal aspects without stenosis. The bilateral posterior communicating arteries are not visualized. Venous sinuses: As permitted by contrast timing, patent. Anatomic variants: None significant. Review of the MIP images confirms the above findings IMPRESSION: 1.  No intracranial large vessel occlusion or significant stenosis. 2.  No hemodynamically significant stenosis in the neck. Electronically Signed   By: Merilyn Baba M.D.   On: 04/14/2022 20:56   DG Chest 2 View  Result Date: 04/14/2022 CLINICAL DATA:  Chest pain EXAM: CHEST - 2 VIEW COMPARISON:  None Available. FINDINGS: Lungs are clear.  No pleural effusion or pneumothorax. Heart is normal in size. Visualized osseous structures are within normal limits. IMPRESSION: Normal chest radiographs. Electronically Signed   By: Julian Hy M.D.   On: 04/14/2022 18:03   CT HEAD WO CONTRAST (5MM)  Result Date: 04/14/2022 CLINICAL DATA:  Headache, new or worsening, neuro deficit (Age 15-49y) EXAM: CT HEAD WITHOUT CONTRAST TECHNIQUE: Contiguous axial images were obtained from the base of the skull through the vertex without intravenous contrast. RADIATION DOSE REDUCTION: This exam was performed according to the departmental dose-optimization program which includes automated exposure control, adjustment of the mA and/or kV according to patient size  and/or use of iterative reconstruction technique. COMPARISON:  None Available. FINDINGS: Brain: No evidence of acute infarction, hemorrhage,  hydrocephalus, extra-axial collection or mass lesion/mass effect. Partially empty sella. Vascular: No hyperdense vessel identified. Skull: No acute fracture. Sinuses/Orbits: Opacified left frontal sinus. Otherwise, clear sinuses. No acute orbital findings. Other: Trace right mastoid effusion. IMPRESSION: 1. No evidence of acute abnormality. 2. Partially empty sella, which is often a normal anatomic variant but can be associated with idiopathic intracranial hypertension (pseudotumor cerebri). 3. Opacified left frontal sinus. Electronically Signed   By: Margaretha Sheffield M.D.   On: 04/14/2022 18:39    Scheduled Meds:   stroke: early stages of recovery book   Does not apply Once   amLODipine  5 mg Oral Daily   aspirin  81 mg Oral Daily   enoxaparin (LOVENOX) injection  40 mg Subcutaneous QHS   Continuous Infusions:   LOS: 0 days   Shelly Coss, MD [facility] P5/29/2023, 9:04 AM

## 2022-04-15 NOTE — ED Notes (Addendum)
Pt removed monitoring equipment and came out of room. Pt ambulated to bathroom w/ urine specimen cup. Pt ambulated w/ steady gait and in NAD

## 2022-04-15 NOTE — ED Notes (Addendum)
Patient wanting to leave, MD Rathore made aware.

## 2022-04-15 NOTE — ED Notes (Signed)
Pt washing up in the sink at this time.

## 2022-04-15 NOTE — Progress Notes (Signed)
OT Cancellation Note  Patient Details Name: Jeanette Petersen MRN: [id number] DOB: 1976/09/20   Cancelled Treatment:    Reason Eval/Treat Not Completed: Patient at procedure or test/ unavailable (ECHO. Will return as schedule allows.)  Breonna Gafford M Rayanna Matusik Rayvion Stumph MSOT, OTR/L Acute Rehab Pager: [phone] Office: [phone] 04/15/2022, 8:16 AM

## 2022-04-15 NOTE — Progress Notes (Signed)
STROKE TEAM PROGRESS NOTE   SUBJECTIVE (INTERVAL HISTORY) Her husband is on the phone and pt is sitting in bed.  Overall her condition is rapidly improving. She stated that her HA is much improved since 5am today. Her BP improved also. However, on exam she still complaining of left sided sensation decrease. Pending MRI.    OBJECTIVE Temp:  [98.1 F (36.7 C)-98.5 F (36.9 C)] 98.1 F (36.7 C) (05/29 0938) Pulse Rate:  [64-102] 88 (05/29 1034) Cardiac Rhythm: Normal sinus rhythm (05/29 0945) Resp:  [12-23] 19 (05/29 1034) BP: (99-190)/(43-121) 153/92 (05/29 0946) SpO2:  [94 %-100 %] 98 % (05/29 1034)  No results for input(s): GLUCAP in the last 168 hours. Recent Labs  Lab 04/14/22 1647  NA 136  K 3.6  CL 101  CO2 25  GLUCOSE 102*  BUN 7  CREATININE 0.83  CALCIUM 9.8   Recent Labs  Lab 04/14/22 1917  AST 16  ALT 18  ALKPHOS 62  BILITOT 0.5  PROT 7.2  ALBUMIN 3.5   Recent Labs  Lab 04/14/22 1647  WBC 12.3*  HGB 16.5*  HCT 50.4*  MCV 91.5  PLT 313   No results for input(s): CKTOTAL, CKMB, CKMBINDEX, TROPONINI in the last 168 hours. No results for input(s): LABPROT, INR in the last 72 hours. No results for input(s): COLORURINE, LABSPEC, [place], GLUCOSEU, HGBUR, BILIRUBINUR, KETONESUR, PROTEINUR, UROBILINOGEN, NITRITE, LEUKOCYTESUR in the last 72 hours.  Invalid input(s): APPERANCEUR  No results found for: CHOL, TRIG, HDL, CHOLHDL, VLDL, LDLCALC No results found for: HGBA1C No results found for: LABOPIA, COCAINSCRNUR, LABBENZ, AMPHETMU, THCU, LABBARB  No results for input(s): ETH in the last 168 hours.  I have personally reviewed the radiological images below and agree with the radiology interpretations.  CT ANGIO HEAD NECK W WO CM  Result Date: 04/14/2022 CLINICAL DATA:  Severe hypertension, left-sided weakness, dizziness, headache EXAM: CT ANGIOGRAPHY HEAD AND NECK TECHNIQUE: Multidetector CT imaging of the head and neck was performed using the standard  protocol during bolus administration of intravenous contrast. Multiplanar CT image reconstructions and MIPs were obtained to evaluate the vascular anatomy. Carotid stenosis measurements (when applicable) are obtained utilizing NASCET criteria, using the distal internal carotid diameter as the denominator. RADIATION DOSE REDUCTION: This exam was performed according to the departmental dose-optimization program which includes automated exposure control, adjustment of the mA and/or kV according to patient size and/or use of iterative reconstruction technique. CONTRAST:  42mL OMNIPAQUE IOHEXOL 350 MG/ML SOLN COMPARISON:  No prior CTA, correlation made with CT head 04/14/2022 FINDINGS: CT HEAD FINDINGS For noncontrast findings, please see same day CT head. CTA NECK FINDINGS Aortic arch: Two-vessel arch with a common origin of the brachiocephalic and left common carotid arteries. Imaged portion shows no evidence of aneurysm or dissection. No significant stenosis of the major arch vessel origins. Right carotid system: No evidence of dissection, occlusion, or hemodynamically significant stenosis (greater than 50%). Left carotid system: No evidence of dissection, occlusion, or hemodynamically significant stenosis (greater than 50%). Vertebral arteries: Evaluation of the proximal vertebral arteries is somewhat limited by photon starvation from overlying soft tissues. Within this limitation, no evidence of dissection, occlusion, or hemodynamically significant stenosis (greater than 50%). Skeleton: No acute osseous abnormality. Other neck: No acute finding. Upper chest: No focal pulmonary opacity or pleural effusion. Review of the MIP images confirms the above findings CTA HEAD FINDINGS Evaluation is somewhat limited by bolus timing. Anterior circulation: Both internal carotid arteries are patent to the termini, without significant stenosis.  A1 segments patent. Normal anterior communicating artery. Mild irregularity in the  right A2 and A3 segments. Anterior cerebral arteries are otherwise patent to their distal aspects. No M1 stenosis or occlusion. Normal MCA bifurcations. Distal MCA branches perfused and symmetric. Posterior circulation: Vertebral arteries patent to the vertebrobasilar junction without stenosis. Posterior inferior cerebellar artery patent on the left. Likely a right AICA. Basilar patent to its distal aspect. Superior cerebellar arteries patent proximally. Patent P1 segments. PCAs perfused to their distal aspects without stenosis. The bilateral posterior communicating arteries are not visualized. Venous sinuses: As permitted by contrast timing, patent. Anatomic variants: None significant. Review of the MIP images confirms the above findings IMPRESSION: 1.  No intracranial large vessel occlusion or significant stenosis. 2.  No hemodynamically significant stenosis in the neck. Electronically Signed   By: Merilyn Baba M.D.   On: 04/14/2022 20:56   DG Chest 2 View  Result Date: 04/14/2022 CLINICAL DATA:  Chest pain EXAM: CHEST - 2 VIEW COMPARISON:  None Available. FINDINGS: Lungs are clear.  No pleural effusion or pneumothorax. Heart is normal in size. Visualized osseous structures are within normal limits. IMPRESSION: Normal chest radiographs. Electronically Signed   By: Julian Hy M.D.   On: 04/14/2022 18:03   CT HEAD WO CONTRAST (5MM)  Result Date: 04/14/2022 CLINICAL DATA:  Headache, new or worsening, neuro deficit (Age 44-49y) EXAM: CT HEAD WITHOUT CONTRAST TECHNIQUE: Contiguous axial images were obtained from the base of the skull through the vertex without intravenous contrast. RADIATION DOSE REDUCTION: This exam was performed according to the departmental dose-optimization program which includes automated exposure control, adjustment of the mA and/or kV according to patient size and/or use of iterative reconstruction technique. COMPARISON:  None Available. FINDINGS: Brain: No evidence of acute  infarction, hemorrhage, hydrocephalus, extra-axial collection or mass lesion/mass effect. Partially empty sella. Vascular: No hyperdense vessel identified. Skull: No acute fracture. Sinuses/Orbits: Opacified left frontal sinus. Otherwise, clear sinuses. No acute orbital findings. Other: Trace right mastoid effusion. IMPRESSION: 1. No evidence of acute abnormality. 2. Partially empty sella, which is often a normal anatomic variant but can be associated with idiopathic intracranial hypertension (pseudotumor cerebri). 3. Opacified left frontal sinus. Electronically Signed   By: Margaretha Sheffield M.D.   On: 04/14/2022 18:39   ECHOCARDIOGRAM COMPLETE  Result Date: 04/15/2022    ECHOCARDIOGRAM REPORT   Patient Name:   MAZELL LUJAN Date of Exam: 04/15/2022 Medical Rec #:  [id number]   Height: Accession #:    [id number]  Weight: Date of Birth:  Aug 05, 1976    BSA: Patient Age:    43 years    BP:           153/92 mmHg Patient Gender: F           HR:           88 bpm. Exam Location:  Inpatient Procedure: 2D Echo, Cardiac Doppler and Color Doppler Indications:    TIA  History:        Patient has prior history of Echocardiogram examinations and                 Patient has no prior history of Echocardiogram examinations.                 Risk Factors:Hypertension and Current Smoker.  Sonographer:    Eartha Inch Sonographer#2:  [place], RVT, RDCS Referring Phys: [id number] Shela Leff  Sonographer Comments: Technically challenging study due to limited acoustic windows. Image acquisition challenging due  to uncooperative patient. IMPRESSIONS  1. Left ventricular ejection fraction, by estimation, is 55 to 60%. The left ventricle has normal function. The left ventricle has no regional wall motion abnormalities. There is mild left ventricular hypertrophy. Left ventricular diastolic parameters are consistent with Grade I diastolic dysfunction (impaired relaxation).  2. Right ventricular systolic function is normal.  The right ventricular size is normal.  3. The mitral valve is normal in structure. No evidence of mitral valve regurgitation. No evidence of mitral stenosis.  4. The aortic valve is tricuspid. Aortic valve regurgitation is not visualized. No aortic stenosis is present.  5. The inferior vena cava is normal in size with greater than 50% respiratory variability, suggesting right atrial pressure of 3 mmHg. Comparison(s): No prior Echocardiogram. FINDINGS  Left Ventricle: Left ventricular ejection fraction, by estimation, is 55 to 60%. The left ventricle has normal function. The left ventricle has no regional wall motion abnormalities. The left ventricular internal cavity size was normal in size. There is  mild left ventricular hypertrophy. Left ventricular diastolic parameters are consistent with Grade I diastolic dysfunction (impaired relaxation). Right Ventricle: The right ventricular size is normal. Right ventricular systolic function is normal. Left Atrium: Left atrial size was normal in size. Right Atrium: Right atrial size was normal in size. Pericardium: There is no evidence of pericardial effusion. Mitral Valve: The mitral valve is normal in structure. No evidence of mitral valve regurgitation. No evidence of mitral valve stenosis. Tricuspid Valve: The tricuspid valve is normal in structure. Tricuspid valve regurgitation is trivial. No evidence of tricuspid stenosis. Aortic Valve: The aortic valve is tricuspid. Aortic valve regurgitation is not visualized. No aortic stenosis is present. Pulmonic Valve: The pulmonic valve was normal in structure. Pulmonic valve regurgitation is not visualized. No evidence of pulmonic stenosis. Aorta: The aortic root is normal in size and structure. Venous: The inferior vena cava is normal in size with greater than 50% respiratory variability, suggesting right atrial pressure of 3 mmHg. IAS/Shunts: No atrial level shunt detected by color flow Doppler.  LEFT VENTRICLE PLAX 2D  LVIDd:         4.70 cm   Diastology LVIDs:         3.40 cm   LV e' medial:    4.57 cm/s LV PW:         1.30 cm   LV E/e' medial:  12.5 LV IVS:        1.20 cm   LV e' lateral:   12.00 cm/s LVOT diam:     2.10 cm   LV E/e' lateral: 4.8 LV SV:         69 LVOT Area:     3.46 cm  RIGHT VENTRICLE RV S prime:     13.70 cm/s TAPSE (M-mode): 2.4 cm LEFT ATRIUM             RIGHT ATRIUM LA Vol (A2C):   63.3 ml RA Area:     16.20 cm LA Vol (A4C):   34.1 ml RA Volume:   44.00 ml LA Biplane Vol: 48.0 ml  AORTIC VALVE LVOT Vmax:   121.00 cm/s LVOT Vmean:  77.700 cm/s LVOT VTI:    0.198 m  AORTA Ao Root diam: 2.70 cm MITRAL VALVE MV Area (PHT): 4.80 cm    SHUNTS MV Decel Time: 158 msec    Systemic VTI:  0.20 m MV E velocity: 57.30 cm/s  Systemic Diam: 2.10 cm MV A velocity: 81.10 cm/s MV E/A ratio:  0.71 Aaron Edelman  Crenshaw MD Electronically signed by Kirk Ruths MD Signature Date/Time: 04/15/2022/10:19:31 AM    Final      PHYSICAL EXAM  Temp:  [98.1 F (36.7 C)-98.5 F (36.9 C)] 98.1 F (36.7 C) (05/29 0938) Pulse Rate:  [64-102] 88 (05/29 1034) Resp:  [12-23] 19 (05/29 1034) BP: (99-190)/(43-121) 153/92 (05/29 0946) SpO2:  [94 %-100 %] 98 % (05/29 1034)  General - Obese, well developed, in no apparent distress.  Ophthalmologic - fundi not visualized due to noncooperation.  Cardiovascular - Regular rhythm and rate.  Mental Status -  Level of arousal and orientation to time, place, and person were intact. Language including expression, naming, repetition, comprehension was assessed and found intact. Fund of Knowledge was assessed and was intact.  Cranial Nerves II - XII - II - Visual field intact OU. III, IV, VI - Extraocular movements intact. V - Facial sensation decreased on the left. VII - Facial movement intact bilaterally. VIII - Hearing & vestibular intact bilaterally. X - Palate elevates symmetrically. XI - Chin turning & shoulder shrug intact bilaterally. XII - Tongue protrusion  intact.  Motor Strength - The patient's strength was normal in all extremities and pronator drift was absent.  Bulk was normal and fasciculations were absent.   Motor Tone - Muscle tone was assessed at the neck and appendages and was normal.  Reflexes - The patient's reflexes were symmetrical in all extremities and she had no pathological reflexes.  Sensory - Light touch, temperature/pinprick were assessed and were decreased on the left.    Coordination - The patient had normal movements in the hands with no ataxia or dysmetria.  Tremor was absent.  Gait and Station - deferred.   ASSESSMENT/PLAN Ms. Jeanette Petersen is a 46 y.o. female with history of uncontrolled HTN, smoker, obesity admitted for HA and left sided numbness. No tPA given due to nondisabling symptoms.    Hypertensive urgency Subjective left hemiparesthesia  CT no acute finding CTA head and neck unremarkable MRI pending MRV pending 2D Echo EF 55 to 60% LDL 131 HgbA1c 5.8 UDS positive for THC and benzos Lovenox for VTE prophylaxis No antithrombotic prior to admission, now on aspirin 81 mg daily. Continue on discharge. Patient counseled to be compliant with her antithrombotic medications Ongoing aggressive stroke risk factor management Therapy recommendations: None Disposition: Likely home today  Headache likely related to uncontrolled hypertension and hypertensive urgency DDx including migraine and pseudotumor cerebri, but no hx of migraine and HA improved with BP control Will refer to [facility] Dr. Billey Gosling  Hypertension High BP on admission Now improved  gradually normalized within 2-3 days. 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 After hours, contact General Neurology

## 2022-04-15 NOTE — ED Notes (Signed)
Adhikari MD at bedside

## 2022-04-15 NOTE — ED Notes (Signed)
Called MRI, MRI says they will send for pt around 1200 and give this RN a call to administer 1mg  ativan.

## 2022-04-15 NOTE — ED Notes (Signed)
Patient ambulatory to restroom with a steady gait.

## 2022-04-15 NOTE — Evaluation (Signed)
Occupational Therapy Evaluation Patient Details Name: Jeanette Petersen MRN: [id number] DOB: 08/28/1976 Today's Date: 04/15/2022   History of Present Illness 46 yo female presenting to ED with hypertension, HA, dizziness, and photophobia. CT negative for acute changes. PMH including hypertension during pregnancy.   Clinical Impression   PTA, pt was living with her husband and was independent. Currently, pt performing at baseline function. Reporting that HA and light sensitivity resolve; however, pt has been spending the day in her room while darkened. Providing education for compensatory techniques to assist with light sensitivity during ADLs/ IADLs. Answered all pt questions. Recommend dc home once medically stable per physician. All acute OT needs met and will sign off. Thank you.      Recommendations for follow up therapy are one component of a multi-disciplinary discharge planning process, led by the attending physician.  Recommendations may be updated based on patient status, additional functional criteria and insurance authorization.   Follow Up Recommendations  No OT follow up    Assistance Recommended at Discharge None  Patient can return home with the following      Functional Status Assessment  Patient has not had a recent decline in their functional status  Equipment Recommendations  None recommended by OT    Recommendations for Other Services       Precautions / Restrictions Precautions Precautions: None Restrictions Weight Bearing Restrictions: No      Mobility Bed Mobility                    Transfers                          Balance                                           ADL either performed or assessed with clinical judgement   ADL Overall ADL's : At baseline                                       General ADL Comments: Patient at baseline for ADLs. Provided education for compensatory strategies for  light sensitivity during ADLs/IADLs.     Vision Baseline Vision/History: 0 No visual deficits Additional Comments: Photophobia upon arrival to ED.  Currently not reporting headache or light sensitivity.  However, she has been in a dark room all day.     Perception     Praxis      Pertinent Vitals/Pain       Hand Dominance Right   Extremity/Trunk Assessment Upper Extremity Assessment Upper Extremity Assessment: Overall WFL for tasks assessed (noted weaker L grasp compared to R)   Lower Extremity Assessment Lower Extremity Assessment: Overall WFL for tasks assessed   Cervical / Trunk Assessment Cervical / Trunk Assessment: Normal   Communication Communication Communication: No difficulties   Cognition                                             General Comments       Exercises     Shoulder Instructions      Home Living Family/patient expects to be discharged to:: Private residence Living  Arrangements: Spouse/significant other Available Help at Discharge: Family Type of Home: Other(Comment) (hotel) Home Access: Level entry     Home Layout: One level     Bathroom Shower/Tub: Teacher, early years/pre: Standard Bathroom Accessibility: Yes   Home Equipment: None   Additional Comments: Patient is from [place] and is here in [place] for job. The above information is based on the hotel.      Prior Functioning/Environment Prior Level of Function : Independent/Modified Independent;Working/employed               ADLs Comments: Works as a Quarry manager full time.        OT Problem List: Pain;Impaired sensation;Decreased knowledge of precautions;Impaired vision/perception      OT Treatment/Interventions:      OT Goals(Current goals can be found in the care plan section) Acute Rehab OT Goals Patient Stated Goal: "Go home." OT Goal Formulation: All assessment and education complete, DC therapy  OT Frequency:      Co-evaluation               AM-PAC OT "6 Clicks" Daily Activity     Outcome Measure Help from another person eating meals?: None Help from another person taking care of personal grooming?: None Help from another person toileting, which includes using toliet, bedpan, or urinal?: None Help from another person bathing (including washing, rinsing, drying)?: None Help from another person to put on and taking off regular upper body clothing?: None Help from another person to put on and taking off regular lower body clothing?: None 6 Click Score: 24   End of Session Nurse Communication: Mobility status  Activity Tolerance: Patient tolerated treatment well Patient left: with call bell/phone within reach (sitting edge of bed)  OT Visit Diagnosis: Muscle weakness (generalized) (M62.81);Dizziness and giddiness (R42);Pain Pain - part of body:  (HA)                Time: 1450-1505 OT Time Calculation (min): 15 min Charges:  OT General Charges $OT Visit: 1 Visit OT Evaluation $OT Eval Low Complexity: 1 Low  Jeanette Petersen MSOT, OTR/L Acute Rehab Pager: [phone] Office: [place] 04/15/2022, 5:26 PM

## 2022-04-15 NOTE — Plan of Care (Signed)
?  Problem: Education: ?Goal: Knowledge of disease or condition will improve ?Outcome: Progressing ?Goal: Knowledge of secondary prevention will improve (SELECT ALL) ?Outcome: Progressing ?Goal: Knowledge of patient specific risk factors will improve (INDIVIDUALIZE FOR PATIENT) ?Outcome: Progressing ?Goal: Individualized Educational Video(s) ?Outcome: Progressing ?  ?Problem: Coping: ?Goal: Will verbalize positive feelings about self ?Outcome: Progressing ?Goal: Will identify appropriate support needs ?Outcome: Progressing ?  ?Problem: Health Behavior/Discharge Planning: ?Goal: Ability to manage health-related needs will improve ?Outcome: Progressing ?  ?Problem: Self-Care: ?Goal: Ability to participate in self-care as condition permits will improve ?Outcome: Progressing ?Goal: Ability to communicate needs accurately will improve ?Outcome: Progressing ?  ?Problem: Nutrition: ?Goal: Risk of aspiration will decrease ?Outcome: Progressing ?  ?Problem: Ischemic Stroke/TIA Tissue Perfusion: ?Goal: Complications of ischemic stroke/TIA will be minimized ?Outcome: Progressing ?  ?Problem: Education: ?Goal: Knowledge of General Education information will improve ?Description: Including pain rating scale, medication(s)/side effects and non-pharmacologic comfort measures ?Outcome: Progressing ?  ?Problem: Health Behavior/Discharge Planning: ?Goal: Ability to manage health-related needs will improve ?Outcome: Progressing ?  ?Problem: Clinical Measurements: ?Goal: Ability to maintain clinical measurements within normal limits will improve ?Outcome: Progressing ?Goal: Will remain free from infection ?Outcome: Progressing ?Goal: Diagnostic test results will improve ?Outcome: Progressing ?Goal: Respiratory complications will improve ?Outcome: Progressing ?Goal: Cardiovascular complication will be avoided ?Outcome: Progressing ?  ?Problem: Activity: ?Goal: Risk for activity intolerance will decrease ?Outcome: Progressing ?  ?Problem:  Nutrition: ?Goal: Adequate nutrition will be maintained ?Outcome: Progressing ?  ?Problem: Coping: ?Goal: Level of anxiety will decrease ?Outcome: Progressing ?  ?Problem: Elimination: ?Goal: Will not experience complications related to bowel motility ?Outcome: Progressing ?Goal: Will not experience complications related to urinary retention ?Outcome: Progressing ?  ?Problem: Pain Managment: ?Goal: General experience of comfort will improve ?Outcome: Progressing ?  ?Problem: Safety: ?Goal: Ability to remain free from injury will improve ?Outcome: Progressing ?  ?Problem: Skin Integrity: ?Goal: Risk for impaired skin integrity will decrease ?Outcome: Progressing ?  ?

## 2022-04-15 NOTE — ED Notes (Signed)
Patient now agreeing to stay and at least complete the MRI.  Patient still refusing labs.  Patient educated on the risks of refusing and educated on the risks of leaving if she got the MRI and still wanted to leave.  Patient and significant other at bedside agreeable.

## 2022-04-15 NOTE — ED Notes (Signed)
RN notified MD about pt unable to complete MRI and pt states she needs to be "sedated". Pt also requesting to eat at this time. Awaiting response

## 2022-04-15 NOTE — ED Notes (Addendum)
Patient states "that ativan didn't work, I need to be knocked out for the MRI."  MD floor coverage paged and made aware.

## 2022-04-15 NOTE — ED Notes (Signed)
Patient refusing MRI, labs and now wanting to leave.  Floor coverage MD made aware and he advised this RN to educate patient on the risks of refusing and have her sign AMA paperwork.

## 2022-04-15 NOTE — ED Notes (Addendum)
Patient unable to complete MRI, stating that she needs to be sedated for procedure.  MD made aware.

## 2022-04-15 NOTE — ED Notes (Signed)
Breakfast order placed ?

## 2022-04-16 DIAGNOSIS — R519 Headache, unspecified: Secondary | ICD-10-CM

## 2022-04-16 LAB — CBC
HCT: 48.4 % — ABNORMAL HIGH (ref 36.0–46.0)
Hemoglobin: 16.6 g/dL — ABNORMAL HIGH (ref 12.0–15.0)
MCH: 30.6 pg (ref 26.0–34.0)
MCHC: 34.3 g/dL (ref 30.0–36.0)
MCV: 89.1 fL (ref 80.0–100.0)
Platelets: 281 10*3/uL (ref 150–400)
RBC: 5.43 MIL/uL — ABNORMAL HIGH (ref 3.87–5.11)
RDW: 14 % (ref 11.5–15.5)
WBC: 12.4 10*3/uL — ABNORMAL HIGH (ref 4.0–10.5)
nRBC: 0 % (ref 0.0–0.2)

## 2022-04-16 LAB — HIV ANTIBODY (ROUTINE TESTING W REFLEX): HIV Screen 4th Generation wRfx: NONREACTIVE

## 2022-04-16 MED ORDER — BUTALBITAL-APAP-CAFFEINE 50-325-40 MG PO TABS: 1.0000 | ORAL_TABLET | Freq: Four times a day (QID) | ORAL | Status: DC | PRN

## 2022-04-16 MED ORDER — AMLODIPINE BESYLATE 10 MG PO TABS
10.0000 mg | ORAL_TABLET | Freq: Every day | ORAL | 1 refills | Status: AC
Start: 2022-04-17 — End: ?

## 2022-04-16 MED ORDER — AMLODIPINE BESYLATE 5 MG PO TABS
5.0000 mg | ORAL_TABLET | Freq: Once | ORAL | Status: AC
  Administered 2022-04-16: 5 mg via ORAL
  Filled 2022-04-16: qty 1

## 2022-04-16 MED ORDER — TOPIRAMATE 50 MG PO TABS: 50.0000 mg | ORAL_TABLET | Freq: Two times a day (BID) | ORAL | 0 refills | Status: AC

## 2022-04-16 MED ORDER — TOPIRAMATE 25 MG PO TABS: 50.0000 mg | ORAL_TABLET | Freq: Two times a day (BID) | ORAL | Status: DC

## 2022-04-16 MED ORDER — ASPIRIN 81 MG PO CHEW: 81.0000 mg | CHEWABLE_TABLET | Freq: Every day | ORAL | 1 refills | Status: AC

## 2022-04-16 MED ORDER — BUTALBITAL-APAP-CAFFEINE 50-325-40 MG PO TABS
1.0000 | ORAL_TABLET | Freq: Two times a day (BID) | ORAL | Status: DC | PRN
Start: 2022-04-16 — End: 2022-04-16

## 2022-04-16 MED ORDER — AMLODIPINE BESYLATE 10 MG PO TABS: 10.0000 mg | ORAL_TABLET | Freq: Every day | ORAL | Status: DC

## 2022-04-16 MED ORDER — DIPHENHYDRAMINE HCL 50 MG/ML IJ SOLN
50.0000 mg | Freq: Once | INTRAMUSCULAR | Status: AC
  Administered 2022-04-16: 50 mg via INTRAVENOUS
  Filled 2022-04-16: qty 1

## 2022-04-16 MED ORDER — BUTALBITAL-APAP-CAFFEINE 50-325-40 MG PO TABS: 1.0000 | ORAL_TABLET | Freq: Two times a day (BID) | ORAL | 0 refills | Status: AC | PRN

## 2022-04-16 MED ORDER — PROCHLORPERAZINE EDISYLATE 10 MG/2ML IJ SOLN
10.0000 mg | Freq: Once | INTRAMUSCULAR | Status: AC
  Administered 2022-04-16: 10 mg via INTRAVENOUS
  Filled 2022-04-16: qty 2

## 2022-04-16 MED ORDER — ROSUVASTATIN CALCIUM 20 MG PO TABS
20.0000 mg | ORAL_TABLET | Freq: Every day | ORAL | 1 refills | Status: AC
Start: 2022-04-17 — End: ?

## 2022-04-16 MED ORDER — KETOROLAC TROMETHAMINE 15 MG/ML IJ SOLN
15.0000 mg | Freq: Once | INTRAMUSCULAR | Status: AC
  Administered 2022-04-16: 15 mg via INTRAVENOUS
  Filled 2022-04-16: qty 1

## 2022-04-16 NOTE — TOC Progression Note (Signed)
Transition of Care [facility]) - Progression Note    Patient Details  Name: Jeanette Petersen MRN: [id number] Date of Birth: [birth year]  Transition of Care [facility]) CM/SW Contact  Beckie Busing, RN Phone Number:[phone]  04/16/2022, 12:46 PM  Clinical Narrative:    MATCH completed to provide uninsured patient with 30 day supply of meds. Scripts have been sent to [facility] pharmacy and will be delivered to bedside.        Expected Discharge Plan and Services           Expected Discharge Date: 04/16/22                                     Social Determinants of Health (SDOH) Interventions    Readmission Risk Interventions     View : No data to display.

## 2022-04-16 NOTE — Progress Notes (Addendum)
STROKE TEAM PROGRESS NOTE   SUBJECTIVE (INTERVAL HISTORY) Her husband is on the phone and pt is sitting in bed.  Overall her condition is rapidly improving. She stated that her HA is much improved since 5am today. Her BP improved also. However, on exam she still complaining of left sided sensation decrease. Pending MRI.    OBJECTIVE Temp:  [97.8 F (36.6 C)-98.5 F (36.9 C)] 97.8 F (36.6 C) (05/30 0813) Pulse Rate:  [87-117] 87 (05/30 0813) Cardiac Rhythm: Normal sinus rhythm (05/30 0700) Resp:  [16-28] 16 (05/30 0813) BP: (129-172)/(72-106) 156/72 (05/30 0830) SpO2:  [93 %-100 %] 95 % (05/30 0813)  No results for input(s): GLUCAP in the last 168 hours. Recent Labs  Lab 04/14/22 1647  NA 136  K 3.6  CL 101  CO2 25  GLUCOSE 102*  BUN 7  CREATININE 0.83  CALCIUM 9.8   Recent Labs  Lab 04/14/22 1917  AST 16  ALT 18  ALKPHOS 62  BILITOT 0.5  PROT 7.2  ALBUMIN 3.5   Recent Labs  Lab 04/14/22 1647 04/15/22 1158 04/16/22 0250  WBC 12.3* 9.8 12.4*  HGB 16.5* 16.1* 16.6*  HCT 50.4* 46.8* 48.4*  MCV 91.5 90.5 89.1  PLT 313 277 281   No results for input(s): CKTOTAL, CKMB, CKMBINDEX, TROPONINI in the last 168 hours. No results for input(s): LABPROT, INR in the last 72 hours. No results for input(s): COLORURINE, LABSPEC, PHURINE, GLUCOSEU, HGBUR, BILIRUBINUR, KETONESUR, PROTEINUR, UROBILINOGEN, NITRITE, LEUKOCYTESUR in the last 72 hours.  Invalid input(s): APPERANCEUR     Component Value Date/Time   CHOL 203 (H) 04/15/2022 1158   TRIG 86 04/15/2022 1158   HDL 55 04/15/2022 1158   CHOLHDL 3.7 04/15/2022 1158   VLDL 17 04/15/2022 1158   LDLCALC 131 (H) 04/15/2022 1158   Lab Results  Component Value Date   HGBA1C 5.8 (H) 04/15/2022      Component Value Date/Time   LABOPIA NONE DETECTED 04/15/2022 0859   COCAINSCRNUR NONE DETECTED 04/15/2022 0859   LABBENZ POSITIVE (A) 04/15/2022 0859   AMPHETMU NONE DETECTED 04/15/2022 0859   THCU POSITIVE (A) 04/15/2022  0859   LABBARB NONE DETECTED 04/15/2022 0859    No results for input(s): ETH in the last 168 hours.  I have personally reviewed the radiological images below and agree with the radiology interpretations.  CT ANGIO HEAD NECK W WO CM  Result Date: 04/14/2022 CLINICAL DATA:  Severe hypertension, left-sided weakness, dizziness, headache EXAM: CT ANGIOGRAPHY HEAD AND NECK TECHNIQUE: Multidetector CT imaging of the head and neck was performed using the standard protocol during bolus administration of intravenous contrast. Multiplanar CT image reconstructions and MIPs were obtained to evaluate the vascular anatomy. Carotid stenosis measurements (when applicable) are obtained utilizing NASCET criteria, using the distal internal carotid diameter as the denominator. RADIATION DOSE REDUCTION: This exam was performed according to the departmental dose-optimization program which includes automated exposure control, adjustment of the mA and/or kV according to patient size and/or use of iterative reconstruction technique. CONTRAST:  75mL OMNIPAQUE IOHEXOL 350 MG/ML SOLN COMPARISON:  No prior CTA, correlation made with CT head 04/14/2022 FINDINGS: CT HEAD FINDINGS For noncontrast findings, please see same day CT head. CTA NECK FINDINGS Aortic arch: Two-vessel arch with a common origin of the brachiocephalic and left common carotid arteries. Imaged portion shows no evidence of aneurysm or dissection. No significant stenosis of the major arch vessel origins. Right carotid system: No evidence of dissection, occlusion, or hemodynamically significant stenosis (greater than 50%). Left  carotid system: No evidence of dissection, occlusion, or hemodynamically significant stenosis (greater than 50%). Vertebral arteries: Evaluation of the proximal vertebral arteries is somewhat limited by photon starvation from overlying soft tissues. Within this limitation, no evidence of dissection, occlusion, or hemodynamically significant  stenosis (greater than 50%). Skeleton: No acute osseous abnormality. Other neck: No acute finding. Upper chest: No focal pulmonary opacity or pleural effusion. Review of the MIP images confirms the above findings CTA HEAD FINDINGS Evaluation is somewhat limited by bolus timing. Anterior circulation: Both internal carotid arteries are patent to the termini, without significant stenosis. A1 segments patent. Normal anterior communicating artery. Mild irregularity in the right A2 and A3 segments. Anterior cerebral arteries are otherwise patent to their distal aspects. No M1 stenosis or occlusion. Normal MCA bifurcations. Distal MCA branches perfused and symmetric. Posterior circulation: Vertebral arteries patent to the vertebrobasilar junction without stenosis. Posterior inferior cerebellar artery patent on the left. Likely a right AICA. Basilar patent to its distal aspect. Superior cerebellar arteries patent proximally. Patent P1 segments. PCAs perfused to their distal aspects without stenosis. The bilateral posterior communicating arteries are not visualized. Venous sinuses: As permitted by contrast timing, patent. Anatomic variants: None significant. Review of the MIP images confirms the above findings IMPRESSION: 1.  No intracranial large vessel occlusion or significant stenosis. 2.  No hemodynamically significant stenosis in the neck. Electronically Signed   By: Wiliam Ke M.D.   On: 04/14/2022 20:56   DG Chest 2 View  Result Date: 04/14/2022 CLINICAL DATA:  Chest pain EXAM: CHEST - 2 VIEW COMPARISON:  None Available. FINDINGS: Lungs are clear.  No pleural effusion or pneumothorax. Heart is normal in size. Visualized osseous structures are within normal limits. IMPRESSION: Normal chest radiographs. Electronically Signed   By: Charline Bills M.D.   On: 04/14/2022 18:03   CT HEAD WO CONTRAST ( )  Result Date: 04/14/2022 CLINICAL DATA:  Headache, new or worsening, neuro deficit (Age 36-49y) EXAM: CT  HEAD WITHOUT CONTRAST TECHNIQUE: Contiguous axial images were obtained from the base of the skull through the vertex without intravenous contrast. RADIATION DOSE REDUCTION: This exam was performed according to the departmental dose-optimization program which includes automated exposure control, adjustment of the mA and/or kV according to patient size and/or use of iterative reconstruction technique. COMPARISON:  None Available. FINDINGS: Brain: No evidence of acute infarction, hemorrhage, hydrocephalus, extra-axial collection or mass lesion/mass effect. Partially empty sella. Vascular: No hyperdense vessel identified. Skull: No acute fracture. Sinuses/Orbits: Opacified left frontal sinus. Otherwise, clear sinuses. No acute orbital findings. Other: Trace right mastoid effusion. IMPRESSION: 1. No evidence of acute abnormality. 2. Partially empty sella, which is often a normal anatomic variant but can be associated with idiopathic intracranial hypertension (pseudotumor cerebri). 3. Opacified left frontal sinus. Electronically Signed   By: Feliberto Harts M.D.   On: 04/14/2022 18:39   MR BRAIN WO CONTRAST  Result Date: 04/15/2022 CLINICAL DATA:  Headache EXAM: MRI HEAD WITHOUT CONTRAST MRV HEAD WITHOUT CONTRAST TECHNIQUE: Multiplanar, multi-echo pulse sequences of the brain and surrounding structures were acquired without intravenous contrast. Angiographic images of the intracranial venous structures were acquired using MRV technique without intravenous contrast. COMPARISON:  No prior MRI or MRV; correlation is made with CT head and CTA head neck 04/14/2022 FINDINGS: MRI HEAD WITHOUT CONTRAST Brain: No restricted diffusion to suggest acute or subacute infarct. No acute hemorrhage, mass, mass effect, or midline shift. No hemosiderin deposition to suggest remote hemorrhage. No hydrocephalus or extra-axial collection. Vascular: Normal arterial flow voids.  Skull and upper cervical spine: Normal marrow signal.  Sinuses/Orbits: Mucosal thickening in the left frontal sinus and left anterior ethmoid air cells. Otherwise clear. The orbits are unremarkable. Other: Fluid in the right-greater-than-left mastoid air cells. MR VENOGRAM WITHOUT CONTRAST There is no evidence of dural venous sinus or deep cerebral vein thrombosis. The right transverse and sigmoid sinus are dominant, and the left transverse and sigmoid sinus are hypoplastic. No dural venous sinus stenosis. IMPRESSION: 1.  No acute intracranial process. 2. No evidence of dural venous sinus thrombosis or stenosis. Electronically Signed   By: Wiliam Ke M.D.   On: 04/15/2022 19:50   MR MRV HEAD WO CM  Result Date: 04/15/2022 CLINICAL DATA:  Headache EXAM: MRI HEAD WITHOUT CONTRAST MRV HEAD WITHOUT CONTRAST TECHNIQUE: Multiplanar, multi-echo pulse sequences of the brain and surrounding structures were acquired without intravenous contrast. Angiographic images of the intracranial venous structures were acquired using MRV technique without intravenous contrast. COMPARISON:  No prior MRI or MRV; correlation is made with CT head and CTA head neck 04/14/2022 FINDINGS: MRI HEAD WITHOUT CONTRAST Brain: No restricted diffusion to suggest acute or subacute infarct. No acute hemorrhage, mass, mass effect, or midline shift. No hemosiderin deposition to suggest remote hemorrhage. No hydrocephalus or extra-axial collection. Vascular: Normal arterial flow voids. Skull and upper cervical spine: Normal marrow signal. Sinuses/Orbits: Mucosal thickening in the left frontal sinus and left anterior ethmoid air cells. Otherwise clear. The orbits are unremarkable. Other: Fluid in the right-greater-than-left mastoid air cells. MR VENOGRAM WITHOUT CONTRAST There is no evidence of dural venous sinus or deep cerebral vein thrombosis. The right transverse and sigmoid sinus are dominant, and the left transverse and sigmoid sinus are hypoplastic. No dural venous sinus stenosis. IMPRESSION: 1.   No acute intracranial process. 2. No evidence of dural venous sinus thrombosis or stenosis. Electronically Signed   By: Wiliam Ke M.D.   On: 04/15/2022 19:50   ECHOCARDIOGRAM COMPLETE  Result Date: 04/15/2022    ECHOCARDIOGRAM REPORT   Patient Name:   TERRICA DUECKER Date of Exam: 04/15/2022 Medical Rec #:  [id number]   Height: Accession #:    [id number]  Weight: Date of Birth:  Nov 02, 1976    BSA: Patient Age:    45 years    BP:           153/92 mmHg Patient Gender: F           HR:           88 bpm. Exam Location:  Inpatient Procedure: 2D Echo, Cardiac Doppler and Color Doppler Indications:    TIA  History:        Patient has prior history of Echocardiogram examinations and                 Patient has no prior history of Echocardiogram examinations.                 Risk Factors:Hypertension and Current Smoker.  Sonographer:    Milda Smart Sonographer#2:  Gertie Fey RDMS, RVT, RDCS Referring Phys: [id number] John Giovanni  Sonographer Comments: Technically challenging study due to limited acoustic windows. Image acquisition challenging due to uncooperative patient. IMPRESSIONS  1. Left ventricular ejection fraction, by estimation, is 55 to 60%. The left ventricle has normal function. The left ventricle has no regional wall motion abnormalities. There is mild left ventricular hypertrophy. Left ventricular diastolic parameters are consistent with Grade I diastolic dysfunction (impaired relaxation).  2. Right ventricular systolic function is normal. The right  ventricular size is normal.  3. The mitral valve is normal in structure. No evidence of mitral valve regurgitation. No evidence of mitral stenosis.  4. The aortic valve is tricuspid. Aortic valve regurgitation is not visualized. No aortic stenosis is present.  5. The inferior vena cava is normal in size with greater than 50% respiratory variability, suggesting right atrial pressure of 3 mmHg. Comparison(s): No prior Echocardiogram. FINDINGS  Left  Ventricle: Left ventricular ejection fraction, by estimation, is 55 to 60%. The left ventricle has normal function. The left ventricle has no regional wall motion abnormalities. The left ventricular internal cavity size was normal in size. There is  mild left ventricular hypertrophy. Left ventricular diastolic parameters are consistent with Grade I diastolic dysfunction (impaired relaxation). Right Ventricle: The right ventricular size is normal. Right ventricular systolic function is normal. Left Atrium: Left atrial size was normal in size. Right Atrium: Right atrial size was normal in size. Pericardium: There is no evidence of pericardial effusion. Mitral Valve: The mitral valve is normal in structure. No evidence of mitral valve regurgitation. No evidence of mitral valve stenosis. Tricuspid Valve: The tricuspid valve is normal in structure. Tricuspid valve regurgitation is trivial. No evidence of tricuspid stenosis. Aortic Valve: The aortic valve is tricuspid. Aortic valve regurgitation is not visualized. No aortic stenosis is present. Pulmonic Valve: The pulmonic valve was normal in structure. Pulmonic valve regurgitation is not visualized. No evidence of pulmonic stenosis. Aorta: The aortic root is normal in size and structure. Venous: The inferior vena cava is normal in size with greater than 50% respiratory variability, suggesting right atrial pressure of 3 mmHg. IAS/Shunts: No atrial level shunt detected by color flow Doppler.  LEFT VENTRICLE PLAX 2D LVIDd:         4.70 cm   Diastology LVIDs:         3.40 cm   LV e' medial:    4.57 cm/s LV PW:         1.30 cm   LV E/e' medial:  12.5 LV IVS:        1.20 cm   LV e' lateral:   12.00 cm/s LVOT diam:     2.10 cm   LV E/e' lateral: 4.8 LV SV:         69 LVOT Area:     3.46 cm  RIGHT VENTRICLE RV S prime:     13.70 cm/s TAPSE (M-mode): 2.4 cm LEFT ATRIUM             RIGHT ATRIUM LA Vol (A2C):   63.3 ml RA Area:     16.20 cm LA Vol (A4C):   34.1 ml RA Volume:    44.00 ml LA Biplane Vol: 48.0 ml  AORTIC VALVE LVOT Vmax:   121.00 cm/s LVOT Vmean:  77.700 cm/s LVOT VTI:    0.198 m  AORTA Ao Root diam: 2.70 cm MITRAL VALVE MV Area (PHT): 4.80 cm    SHUNTS MV Decel Time: 158 msec    Systemic VTI:  0.20 m MV E velocity: 57.30 cm/s  Systemic Diam: 2.10 cm MV A velocity: 81.10 cm/s MV E/A ratio:  0.71 Olga Millers MD Electronically signed by Olga Millers MD Signature Date/Time: 04/15/2022/10:19:31 AM    Final      PHYSICAL EXAM  Temp:  [97.8 F (36.6 C)-98.5 F (36.9 C)] 97.8 F (36.6 C) (05/30 0813) Pulse Rate:  [87-117] 87 (05/30 0813) Resp:  [16-28] 16 (05/30 0813) BP: (129-172)/(72-106) 156/72 (05/30 0830) SpO2:  [93 %-  100 %] 95 % (05/30 0813)  General - Obese, well developed, in no apparent distress.  Ophthalmologic - fundi not visualized due to small pupils.  Cardiovascular - Regular rhythm and rate.  Mental Status -  Level of arousal and orientation to time, place, and person were intact. Language including expression, naming, repetition, comprehension was assessed and found intact. Fund of Knowledge was assessed and was intact.  Cranial Nerves II - XII - II - Visual field intact OU. III, IV, VI - Extraocular movements intact. V - Facial sensation mildly decreased on the left. VII - Facial movement intact bilaterally. VIII - Hearing & vestibular intact bilaterally. X - Palate elevates symmetrically. XI - Chin turning & shoulder shrug intact bilaterally. XII - Tongue protrusion intact.  Motor Strength - The patient's strength was normal in all extremities and pronator drift was absent.  Bulk was normal and fasciculations were absent.   Motor Tone - Muscle tone was assessed at the neck and appendages and was normal.  Reflexes - The patient's reflexes were symmetrical in all extremities and she had no pathological reflexes.  Sensory - Light touch, temperature/pinprick were assessed and were subjectively decreased on the left.     Coordination - The patient had normal movements in the hands with no ataxia or dysmetria.  Tremor was absent.  Gait and Station - deferred.   ASSESSMENT/PLAN Jeanette Petersen is a 46 y.o. female with history of uncontrolled HTN, smoker, obesity admitted for HA and left sided numbness. No tPA given due to nondisabling symptoms.    Hypertensive urgency Subjective left hemiparesthesia  CT no acute finding CTA head and neck unremarkable MRI no acute finding MRV no CVST 2D Echo EF 55 to 60% LDL 131 HgbA1c 5.8 UDS positive for THC and benzos Lovenox for VTE prophylaxis No antithrombotic prior to admission, now on aspirin 81 mg daily. Continue on discharge. Patient counseled to be compliant with her antithrombotic medications Ongoing aggressive stroke risk factor management Therapy recommendations: None Disposition: Likely home today  Headache Could be related to uncontrolled hypertension and hypertensive urgency DDx including migraine and pseudotumor cerebri, but no hx of migraine Pt denies blurry vision or vision changes Pupils are small and fundi not able to visualize via ophthalmoscope at bedside Put on tompamax 50 bid, continue on discharge Fioricet PRN but no more than twice a day and more than 5 days to limit rebound HA. Will refer to [facility] Dr. Delena Bali  Hypertension High BP on admission Now improved  gradually normalized within 2-3 days. 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 Please call with questions. Pt will follow up with Dr. Delena Bali at [facility] ASAP. Thanks for the  consult.   Marvel Plan, MD PhD Stroke Neurology 04/16/2022 11:39 AM    To contact Stroke Continuity provider, please refer to [email or web address]. After hours, contact General Neurology

## 2022-04-16 NOTE — TOC CAGE-AID Note (Signed)
Transition of Care [facility]) - CAGE-AID Screening   Patient Details  Name: Sueanne Vigne MRN: [id number] Date of Birth: 03-10-1976  [facility] CM/SW Contact:    Antwoine Zorn C Tarpley-Carter, [place] Phone Number: 04/16/2022, 11:48 AM   Clinical Narrative: Pt participated in [place].  Pt stated she does use substance.  Pt was offered resources, due to usage of substance.     Aizza Santiago Tarpley-Carter, MSW, LCSW-A Pronouns:  She/Her/Hers [facility]Transitions of Care Clinical Social Worker Direct Number:  [phone] [email or web address]   CAGE-AID Screening:    Have You Ever Felt You Ought to Cut Down on Your Drinking or Drug Use?: Yes Have People Annoyed You By Critizing Your Drinking Or Drug Use?: No Have You Felt Bad Or Guilty About Your Drinking Or Drug Use?: No Have You Ever Had a Drink or Used Drugs First Thing In The Morning to Steady Your Nerves or to Get Rid of a Hangover?: No CAGE-AID Score: 1  Substance Abuse Education Offered: Yes  Substance abuse interventions: Scientist, clinical (histocompatibility and immunogenetics)

## 2022-04-16 NOTE — Plan of Care (Signed)
?  Problem: Education: ?Goal: Knowledge of disease or condition will improve ?Outcome: Progressing ?Goal: Knowledge of secondary prevention will improve (SELECT ALL) ?Outcome: Progressing ?Goal: Knowledge of patient specific risk factors will improve (INDIVIDUALIZE FOR PATIENT) ?Outcome: Progressing ?Goal: Individualized Educational Video(s) ?Outcome: Progressing ?  ?Problem: Coping: ?Goal: Will verbalize positive feelings about self ?Outcome: Progressing ?Goal: Will identify appropriate support needs ?Outcome: Progressing ?  ?Problem: Health Behavior/Discharge Planning: ?Goal: Ability to manage health-related needs will improve ?Outcome: Progressing ?  ?Problem: Self-Care: ?Goal: Ability to participate in self-care as condition permits will improve ?Outcome: Progressing ?Goal: Ability to communicate needs accurately will improve ?Outcome: Progressing ?  ?Problem: Nutrition: ?Goal: Risk of aspiration will decrease ?Outcome: Progressing ?  ?Problem: Ischemic Stroke/TIA Tissue Perfusion: ?Goal: Complications of ischemic stroke/TIA will be minimized ?Outcome: Progressing ?  ?Problem: Education: ?Goal: Knowledge of General Education information will improve ?Description: Including pain rating scale, medication(s)/side effects and non-pharmacologic comfort measures ?Outcome: Progressing ?  ?Problem: Health Behavior/Discharge Planning: ?Goal: Ability to manage health-related needs will improve ?Outcome: Progressing ?  ?Problem: Clinical Measurements: ?Goal: Ability to maintain clinical measurements within normal limits will improve ?Outcome: Progressing ?Goal: Will remain free from infection ?Outcome: Progressing ?Goal: Diagnostic test results will improve ?Outcome: Progressing ?Goal: Respiratory complications will improve ?Outcome: Progressing ?Goal: Cardiovascular complication will be avoided ?Outcome: Progressing ?  ?Problem: Activity: ?Goal: Risk for activity intolerance will decrease ?Outcome: Progressing ?  ?Problem:  Nutrition: ?Goal: Adequate nutrition will be maintained ?Outcome: Progressing ?  ?Problem: Coping: ?Goal: Level of anxiety will decrease ?Outcome: Progressing ?  ?Problem: Elimination: ?Goal: Will not experience complications related to bowel motility ?Outcome: Progressing ?Goal: Will not experience complications related to urinary retention ?Outcome: Progressing ?  ?Problem: Pain Managment: ?Goal: General experience of comfort will improve ?Outcome: Progressing ?  ?Problem: Safety: ?Goal: Ability to remain free from injury will improve ?Outcome: Progressing ?  ?Problem: Skin Integrity: ?Goal: Risk for impaired skin integrity will decrease ?Outcome: Progressing ?  ?

## 2022-04-16 NOTE — Discharge Summary (Signed)
Physician Discharge Summary  Jeanette Petersen GYK:[id number] DOB: 06/30/1976 DOA: 04/14/2022  PCP: Pcp, No  Admit date: 04/14/2022 Discharge date: 04/16/2022  Admitted From: Home Disposition:  Home  Discharge Condition:Stable CODE STATUS:FULL Diet recommendation: Heart Healthy   Brief/Interim Summary:  Patient is a 46 year old female with history of hypertension who presented with complaint of headache.  She was recently started on amlodipine for hypertension.  On presentation she was hypertensive.  CT head was negative for acute intracranial findings.  CT head and neck were negative for LVO.  Patient remained hypertensive in the emergency department.  Patient's presenting symptom of headache, photophobia, dizziness was suspicious for TIA/stroke so patient was admitted for further management.  Neurology also consulted.  MRI/MRV negative for any acute intracranial findings.  She has been referred to outpatient neurology/headache clinic.  Medically stable for discharge today.  Following problems were addressed during her hospitalization:  Suspected TIA: Presented with headache, photophobia, dizziness.  Also had left upper and lower extremity diminished sensation, mildly reduced strength.  CT head negative for acute intracranial abnormality.  CTA head and neck negative for LVO.  Neurology consulted. MRI/MRV negative for any acute abnormalities.  Echo showed EF of 55 to 60%, grade 1 diastolic dysfunction.  Hemoglobin A1c 5.8.  LDL of 131.  Started on aspirin and Crestor.  PT/OT did not recommend any follow-up.  Hypertensive emergency: Recently diagnosed with hypertension, takes amlodipine.  Very hypertensive on presentation.  Continue amlodipine 10 mg daily at home.  We requested the patient to monitor her blood pressure at home  Headache: Unclear etiology.  Given migraine cocktail today.  She has been referred to headache clinic.  Continue Topamax and Fioricet   Chest pain: Troponins negative.  Chest  x-ray did not show any acute problem.  Resolved   Leukocytosis: Mild.  Most likely reactive.    Ossified left frontal sinus on CT: MRI/MRV did not show any acute findings   Obesity: Patient is morbidly obese  Discharge Diagnoses:  Principal Problem:   TIA (transient ischemic attack) Active Problems:   Hypertensive emergency   Chest pain   Headache    Discharge Instructions  Discharge Instructions     Ambulatory referral to Neurology   Complete by: As directed    Refer to Dr. Delena Bali for headache evaluation resume 4 weeks.  Thanks.   Diet - low sodium heart healthy   Complete by: As directed    Discharge instructions   Complete by: As directed    1)Please take prescribed medications as instructed 2)Follow up with neurology in 1 month.  Name and number the provider has been attached.  Call for appointment 3)Monitor your blood pressure at home   Increase activity slowly   Complete by: As directed       Allergies as of 04/16/2022   No Known Allergies      Medication List     TAKE these medications    acetaminophen 325 MG tablet Commonly known as: Tylenol Take 2 tablets (650 mg total) by mouth every 6 (six) hours as needed for moderate pain or headache.   amLODipine 10 MG tablet Commonly known as: NORVASC Take 1 tablet (10 mg total) by mouth daily. Start taking on: Apr 17, 2022 What changed:  medication strength how much to take   aspirin 81 MG chewable tablet Chew 1 tablet (81 mg total) by mouth daily. Start taking on: Apr 17, 2022   butalbital-acetaminophen-caffeine 50-325-40 MG tablet Commonly known as: FIORICET Take 1 tablet by mouth  every 12 (twelve) hours as needed for headache.   loratadine 10 MG tablet Commonly known as: CLARITIN Take 10 mg by mouth daily as needed for allergies.   naproxen sodium 220 MG tablet Commonly known as: ALEVE Take 220 mg by mouth daily as needed (headache).   rosuvastatin 20 MG tablet Commonly known as:  CRESTOR Take 1 tablet (20 mg total) by mouth daily. Start taking on: Apr 17, 2022   topiramate 50 MG tablet Commonly known as: TOPAMAX Take 1 tablet (50 mg total) by mouth 2 (two) times daily.        Follow-up Information     Ocie Doynehima, Jennifer, MD. Schedule an appointment as soon as possible for a visit in 1 month(s).   Specialty: Neurology Contact information: [address], [address] [place] [address] [phone]                No Known Allergies  Consultations: Neurology   Procedures/Studies: CT ANGIO HEAD NECK W WO CM  Result Date: 04/14/2022 CLINICAL DATA:  Severe hypertension, left-sided weakness, dizziness, headache EXAM: CT ANGIOGRAPHY HEAD AND NECK TECHNIQUE: Multidetector CT imaging of the head and neck was performed using the standard protocol during bolus administration of intravenous contrast. Multiplanar CT image reconstructions and MIPs were obtained to evaluate the vascular anatomy. Carotid stenosis measurements (when applicable) are obtained utilizing NASCET criteria, using the distal internal carotid diameter as the denominator. RADIATION DOSE REDUCTION: This exam was performed according to the departmental dose-optimization program which includes automated exposure control, adjustment of the mA and/or kV according to patient size and/or use of iterative reconstruction technique. CONTRAST:  75mL OMNIPAQUE IOHEXOL 350 MG/ML SOLN COMPARISON:  No prior CTA, correlation made with CT head 04/14/2022 FINDINGS: CT HEAD FINDINGS For noncontrast findings, please see same day CT head. CTA NECK FINDINGS Aortic arch: Two-vessel arch with a common origin of the brachiocephalic and left common carotid arteries. Imaged portion shows no evidence of aneurysm or dissection. No significant stenosis of the major arch vessel origins. Right carotid system: No evidence of dissection, occlusion, or hemodynamically significant stenosis (greater than 50%). Left carotid system: No  evidence of dissection, occlusion, or hemodynamically significant stenosis (greater than 50%). Vertebral arteries: Evaluation of the proximal vertebral arteries is somewhat limited by photon starvation from overlying soft tissues. Within this limitation, no evidence of dissection, occlusion, or hemodynamically significant stenosis (greater than 50%). Skeleton: No acute osseous abnormality. Other neck: No acute finding. Upper chest: No focal pulmonary opacity or pleural effusion. Review of the MIP images confirms the above findings CTA HEAD FINDINGS Evaluation is somewhat limited by bolus timing. Anterior circulation: Both internal carotid arteries are patent to the termini, without significant stenosis. A1 segments patent. Normal anterior communicating artery. Mild irregularity in the right A2 and A3 segments. Anterior cerebral arteries are otherwise patent to their distal aspects. No M1 stenosis or occlusion. Normal MCA bifurcations. Distal MCA branches perfused and symmetric. Posterior circulation: Vertebral arteries patent to the vertebrobasilar junction without stenosis. Posterior inferior cerebellar artery patent on the left. Likely a right AICA. Basilar patent to its distal aspect. Superior cerebellar arteries patent proximally. Patent P1 segments. PCAs perfused to their distal aspects without stenosis. The bilateral posterior communicating arteries are not visualized. Venous sinuses: As permitted by contrast timing, patent. Anatomic variants: None significant. Review of the MIP images confirms the above findings IMPRESSION: 1.  No intracranial large vessel occlusion or significant stenosis. 2.  No hemodynamically significant stenosis in the neck. Electronically Signed  By: Wiliam Ke M.D.   On: 04/14/2022 20:56   DG Chest 2 View  Result Date: 04/14/2022 CLINICAL DATA:  Chest pain EXAM: CHEST - 2 VIEW COMPARISON:  None Available. FINDINGS: Lungs are clear.  No pleural effusion or pneumothorax. Heart is  normal in size. Visualized osseous structures are within normal limits. IMPRESSION: Normal chest radiographs. Electronically Signed   By: Charline Bills M.D.   On: 04/14/2022 18:03   CT HEAD WO CONTRAST ( )  Result Date: 04/14/2022 CLINICAL DATA:  Headache, new or worsening, neuro deficit (Age 44-49y) EXAM: CT HEAD WITHOUT CONTRAST TECHNIQUE: Contiguous axial images were obtained from the base of the skull through the vertex without intravenous contrast. RADIATION DOSE REDUCTION: This exam was performed according to the departmental dose-optimization program which includes automated exposure control, adjustment of the mA and/or kV according to patient size and/or use of iterative reconstruction technique. COMPARISON:  None Available. FINDINGS: Brain: No evidence of acute infarction, hemorrhage, hydrocephalus, extra-axial collection or mass lesion/mass effect. Partially empty sella. Vascular: No hyperdense vessel identified. Skull: No acute fracture. Sinuses/Orbits: Opacified left frontal sinus. Otherwise, clear sinuses. No acute orbital findings. Other: Trace right mastoid effusion. IMPRESSION: 1. No evidence of acute abnormality. 2. Partially empty sella, which is often a normal anatomic variant but can be associated with idiopathic intracranial hypertension (pseudotumor cerebri). 3. Opacified left frontal sinus. Electronically Signed   By: Feliberto Harts M.D.   On: 04/14/2022 18:39   MR BRAIN WO CONTRAST  Result Date: 04/15/2022 CLINICAL DATA:  Headache EXAM: MRI HEAD WITHOUT CONTRAST MRV HEAD WITHOUT CONTRAST TECHNIQUE: Multiplanar, multi-echo pulse sequences of the brain and surrounding structures were acquired without intravenous contrast. Angiographic images of the intracranial venous structures were acquired using MRV technique without intravenous contrast. COMPARISON:  No prior MRI or MRV; correlation is made with CT head and CTA head neck 04/14/2022 FINDINGS: MRI HEAD WITHOUT CONTRAST Brain:  No restricted diffusion to suggest acute or subacute infarct. No acute hemorrhage, mass, mass effect, or midline shift. No hemosiderin deposition to suggest remote hemorrhage. No hydrocephalus or extra-axial collection. Vascular: Normal arterial flow voids. Skull and upper cervical spine: Normal marrow signal. Sinuses/Orbits: Mucosal thickening in the left frontal sinus and left anterior ethmoid air cells. Otherwise clear. The orbits are unremarkable. Other: Fluid in the right-greater-than-left mastoid air cells. MR VENOGRAM WITHOUT CONTRAST There is no evidence of dural venous sinus or deep cerebral vein thrombosis. The right transverse and sigmoid sinus are dominant, and the left transverse and sigmoid sinus are hypoplastic. No dural venous sinus stenosis. IMPRESSION: 1.  No acute intracranial process. 2. No evidence of dural venous sinus thrombosis or stenosis. Electronically Signed   By: Wiliam Ke M.D.   On: 04/15/2022 19:50   MR MRV HEAD WO CM  Result Date: 04/15/2022 CLINICAL DATA:  Headache EXAM: MRI HEAD WITHOUT CONTRAST MRV HEAD WITHOUT CONTRAST TECHNIQUE: Multiplanar, multi-echo pulse sequences of the brain and surrounding structures were acquired without intravenous contrast. Angiographic images of the intracranial venous structures were acquired using MRV technique without intravenous contrast. COMPARISON:  No prior MRI or MRV; correlation is made with CT head and CTA head neck 04/14/2022 FINDINGS: MRI HEAD WITHOUT CONTRAST Brain: No restricted diffusion to suggest acute or subacute infarct. No acute hemorrhage, mass, mass effect, or midline shift. No hemosiderin deposition to suggest remote hemorrhage. No hydrocephalus or extra-axial collection. Vascular: Normal arterial flow voids. Skull and upper cervical spine: Normal marrow signal. Sinuses/Orbits: Mucosal thickening in the left frontal sinus and  left anterior ethmoid air cells. Otherwise clear. The orbits are unremarkable. Other: Fluid in  the right-greater-than-left mastoid air cells. MR VENOGRAM WITHOUT CONTRAST There is no evidence of dural venous sinus or deep cerebral vein thrombosis. The right transverse and sigmoid sinus are dominant, and the left transverse and sigmoid sinus are hypoplastic. No dural venous sinus stenosis. IMPRESSION: 1.  No acute intracranial process. 2. No evidence of dural venous sinus thrombosis or stenosis. Electronically Signed   By: Wiliam Ke M.D.   On: 04/15/2022 19:50   ECHOCARDIOGRAM COMPLETE  Result Date: 04/15/2022    ECHOCARDIOGRAM REPORT   Patient Name:   Jeanette Petersen Date of Exam: 04/15/2022 Medical Rec #:  [id number]   Height: Accession #:    [id number]  Weight: Date of Birth:  [birth year]    BSA: Patient Age:    45 years    BP:           153/92 mmHg Patient Gender: F           HR:           88 bpm. Exam Location:  Inpatient Procedure: 2D Echo, Cardiac Doppler and Color Doppler Indications:    TIA  History:        Patient has prior history of Echocardiogram examinations and                 Patient has no prior history of Echocardiogram examinations.                 Risk Factors:Hypertension and Current Smoker.  Sonographer:    Milda Smart Sonographer#2:  Gertie Fey RDMS, RVT, RDCS Referring Phys: [id number] John Giovanni  Sonographer Comments: Technically challenging study due to limited acoustic windows. Image acquisition challenging due to uncooperative patient. IMPRESSIONS  1. Left ventricular ejection fraction, by estimation, is 55 to 60%. The left ventricle has normal function. The left ventricle has no regional wall motion abnormalities. There is mild left ventricular hypertrophy. Left ventricular diastolic parameters are consistent with Grade I diastolic dysfunction (impaired relaxation).  2. Right ventricular systolic function is normal. The right ventricular size is normal.  3. The mitral valve is normal in structure. No evidence of mitral valve regurgitation. No evidence of mitral  stenosis.  4. The aortic valve is tricuspid. Aortic valve regurgitation is not visualized. No aortic stenosis is present.  5. The inferior vena cava is normal in size with greater than 50% respiratory variability, suggesting right atrial pressure of 3 mmHg. Comparison(s): No prior Echocardiogram. FINDINGS  Left Ventricle: Left ventricular ejection fraction, by estimation, is 55 to 60%. The left ventricle has normal function. The left ventricle has no regional wall motion abnormalities. The left ventricular internal cavity size was normal in size. There is  mild left ventricular hypertrophy. Left ventricular diastolic parameters are consistent with Grade I diastolic dysfunction (impaired relaxation). Right Ventricle: The right ventricular size is normal. Right ventricular systolic function is normal. Left Atrium: Left atrial size was normal in size. Right Atrium: Right atrial size was normal in size. Pericardium: There is no evidence of pericardial effusion. Mitral Valve: The mitral valve is normal in structure. No evidence of mitral valve regurgitation. No evidence of mitral valve stenosis. Tricuspid Valve: The tricuspid valve is normal in structure. Tricuspid valve regurgitation is trivial. No evidence of tricuspid stenosis. Aortic Valve: The aortic valve is tricuspid. Aortic valve regurgitation is not visualized. No aortic stenosis is present. Pulmonic Valve: The pulmonic valve was normal in structure. Pulmonic valve  regurgitation is not visualized. No evidence of pulmonic stenosis. Aorta: The aortic root is normal in size and structure. Venous: The inferior vena cava is normal in size with greater than 50% respiratory variability, suggesting right atrial pressure of 3 mmHg. IAS/Shunts: No atrial level shunt detected by color flow Doppler.  LEFT VENTRICLE PLAX 2D LVIDd:         4.70 cm   Diastology LVIDs:         3.40 cm   LV e' medial:    4.57 cm/s LV PW:         1.30 cm   LV E/e' medial:  12.5 LV IVS:         1.20 cm   LV e' lateral:   12.00 cm/s LVOT diam:     2.10 cm   LV E/e' lateral: 4.8 LV SV:         69 LVOT Area:     3.46 cm  RIGHT VENTRICLE RV S prime:     13.70 cm/s TAPSE (M-mode): 2.4 cm LEFT ATRIUM             RIGHT ATRIUM LA Vol (A2C):   63.3 ml RA Area:     16.20 cm LA Vol (A4C):   34.1 ml RA Volume:   44.00 ml LA Biplane Vol: 48.0 ml  AORTIC VALVE LVOT Vmax:   121.00 cm/s LVOT Vmean:  77.700 cm/s LVOT VTI:    0.198 m  AORTA Ao Root diam: 2.70 cm MITRAL VALVE MV Area (PHT): 4.80 cm    SHUNTS MV Decel Time: 158 msec    Systemic VTI:  0.20 m MV E velocity: 57.30 cm/s  Systemic Diam: 2.10 cm MV A velocity: 81.10 cm/s MV E/A ratio:  0.71 Olga Millers MD Electronically signed by Olga Millers MD Signature Date/Time: 04/15/2022/10:19:31 AM    Final       Subjective: Patient seen and examined at the bedside this morning.  Overall comfortable, complaining of some headache this morning.  Medically stable for discharge  Discharge Exam: Vitals:   04/16/22 0813 04/16/22 0830  BP: (!) 172/94 (!) 156/72  Pulse: 87   Resp: 16   Temp: 97.8 F (36.6 C)   SpO2: 95%    Vitals:   04/15/22 2316 04/16/22 0417 04/16/22 0813 04/16/22 0830  BP: 138/87 129/79 (!) 172/94 (!) 156/72  Pulse: (!) 113 93 87   Resp: Temp: 98 F (36.7 C) 98.3 F (36.8 C) 97.8 F (36.6 C)   TempSrc: Oral  Oral   SpO2: 97% 100% 95%     General: Pt is alert, awake, not in acute distress Cardiovascular: RRR, S1/S2 +, no rubs, no gallops Respiratory: CTA bilaterally, no wheezing, no rhonchi Abdominal: Soft, NT, ND, bowel sounds + Extremities: no edema, no cyanosis    The results of significant diagnostics from this hospitalization (including imaging, microbiology, ancillary and laboratory) are listed below for reference.     Microbiology: No results found for this or any previous visit (from the past 240 hour(s)).   Labs: BNP (last 3 results) No results for input(s): BNP in the last 8760 hours. Basic  Metabolic Panel: Recent Labs  Lab 04/14/22 1647  NA 136  K 3.6  CL 101  CO2 25  GLUCOSE 102*  BUN 7  CREATININE 0.83  CALCIUM 9.8   Liver Function Tests: Recent Labs  Lab 04/14/22 1917  AST 16  ALT 18  ALKPHOS 62  BILITOT 0.5  PROT  7.2  ALBUMIN 3.5   Recent Labs  Lab 04/14/22 1917  LIPASE 29   No results for input(s): AMMONIA in the last 168 hours. CBC: Recent Labs  Lab 04/14/22 1647 04/15/22 1158 04/16/22 0250  WBC 12.3* 9.8 12.4*  HGB 16.5* 16.1* 16.6*  HCT 50.4* 46.8* 48.4*  MCV 91.5 90.5 89.1  PLT 313 277 281   Cardiac Enzymes: No results for input(s): CKTOTAL, CKMB, CKMBINDEX, TROPONINI in the last 168 hours. BNP: Invalid input(s): POCBNP CBG: No results for input(s): GLUCAP in the last 168 hours. D-Dimer No results for input(s): DDIMER in the last 72 hours. Hgb A1c Recent Labs    04/15/22 1158  HGBA1C 5.8*   Lipid Profile Recent Labs    04/15/22 1158  CHOL 203*  HDL 55  LDLCALC 131*  TRIG 86  CHOLHDL 3.7   Thyroid function studies No results for input(s): TSH, T4TOTAL, T3FREE, THYROIDAB in the last 72 hours.  Invalid input(s): FREET3 Anemia work up No results for input(s): VITAMINB12, FOLATE, FERRITIN, TIBC, IRON, RETICCTPCT in the last 72 hours. Urinalysis No results found for: COLORURINE, APPEARANCEUR, LABSPEC, PHURINE, GLUCOSEU, HGBUR, BILIRUBINUR, KETONESUR, PROTEINUR, UROBILINOGEN, NITRITE, LEUKOCYTESUR Sepsis Labs Invalid input(s): PROCALCITONIN,  WBC,  LACTICIDVEN Microbiology No results found for this or any previous visit (from the past 240 hour(s)).  Please note: You were cared for by a hospitalist during your hospital stay. Once you are discharged, your primary care physician will handle any further medical issues. Please note that NO REFILLS for any discharge medications will be authorized once you are discharged, as it is imperative that you return to your primary care physician (or establish a relationship with a  primary care physician if you do not have one) for your post hospital discharge needs so that they can reassess your need for medications and monitor your lab values.    Time coordinating discharge: 40 minutes  SIGNED:   Burnadette Pop, MD  [facility] 04/16/2022, 11:47 AM Pager [phone]  If 7PM-7AM, please contact night-coverage [email or web address] Password [email or web address]

## 2022-04-18 ENCOUNTER — Ambulatory Visit: Payer: Self-pay
